# Patient Record
Sex: Female | Born: 1985 | Race: Black or African American | Hispanic: No | Marital: Married | Smoking: Never smoker
Health system: Southern US, Community
[De-identification: ages and names within clinical notes are randomized; demographics above are authoritative.]

## PROBLEM LIST (undated history)

## (undated) ENCOUNTER — Inpatient Hospital Stay (HOSPITAL_COMMUNITY): Payer: Self-pay

## (undated) DIAGNOSIS — A6000 Herpesviral infection of urogenital system, unspecified: Secondary | ICD-10-CM

## (undated) DIAGNOSIS — D649 Anemia, unspecified: Secondary | ICD-10-CM

## (undated) HISTORY — DX: Anemia, unspecified: D64.9

## (undated) HISTORY — DX: Herpesviral infection of urogenital system, unspecified: A60.00

## (undated) HISTORY — PX: NO PAST SURGERIES: SHX2092

---
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
## 2005-03-28 ENCOUNTER — Emergency Department (HOSPITAL_COMMUNITY): Admission: EM | Admit: 2005-03-28 | Discharge: 2005-03-28 | Payer: Self-pay | Admitting: Emergency Medicine

## 2005-06-16 ENCOUNTER — Ambulatory Visit: Payer: Self-pay | Admitting: Family Medicine

## 2005-06-19 ENCOUNTER — Ambulatory Visit: Payer: Self-pay | Admitting: Family Medicine

## 2005-09-29 ENCOUNTER — Ambulatory Visit: Payer: Self-pay | Admitting: Family Medicine

## 2005-10-13 ENCOUNTER — Ambulatory Visit: Payer: Self-pay | Admitting: Family Medicine

## 2005-11-24 ENCOUNTER — Ambulatory Visit: Payer: Self-pay | Admitting: Family Medicine

## 2005-11-27 ENCOUNTER — Ambulatory Visit (HOSPITAL_COMMUNITY): Admission: RE | Admit: 2005-11-27 | Discharge: 2005-11-27 | Payer: Self-pay | Admitting: Family Medicine

## 2006-01-02 ENCOUNTER — Emergency Department (HOSPITAL_COMMUNITY): Admission: EM | Admit: 2006-01-02 | Discharge: 2006-01-02 | Payer: Self-pay | Admitting: *Deleted

## 2006-01-23 ENCOUNTER — Ambulatory Visit: Payer: Self-pay | Admitting: Family Medicine

## 2006-01-25 ENCOUNTER — Ambulatory Visit (HOSPITAL_COMMUNITY): Admission: RE | Admit: 2006-01-25 | Discharge: 2006-01-25 | Payer: Self-pay | Admitting: Family Medicine

## 2006-03-27 ENCOUNTER — Ambulatory Visit: Payer: Self-pay | Admitting: Family Medicine

## 2007-01-16 ENCOUNTER — Emergency Department (HOSPITAL_COMMUNITY): Admission: EM | Admit: 2007-01-16 | Discharge: 2007-01-16 | Payer: Self-pay | Admitting: Emergency Medicine

## 2007-04-07 IMAGING — US US SOFT TISSUE HEAD/NECK
1 series · 14 of 25 positions shown · non-contrast
Comparison: None.

CLINICAL DATA: Goiter. 
THYROID ULTRASOUND:
TECHNIQUE: Ultrasound examination of the thyroid gland and adjacent soft tissue structures was performed.

[Series 1: unknown · 0.09mm/px · 14 of 40 slices shown]
[im 1/40]
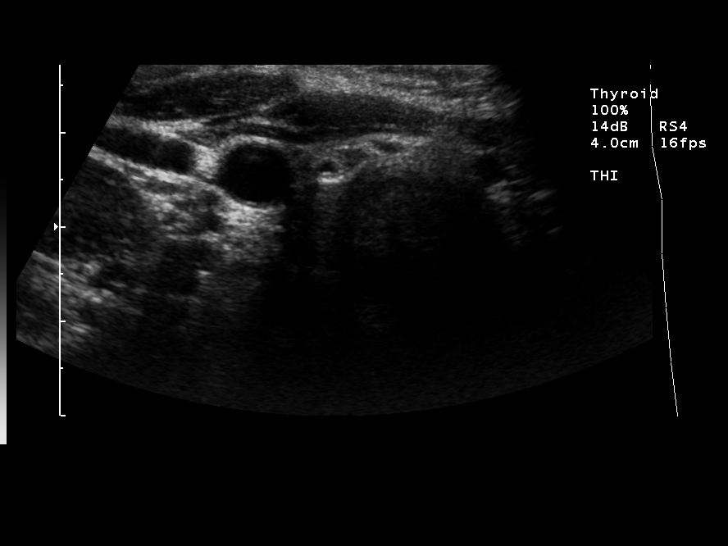
[im 4/40]
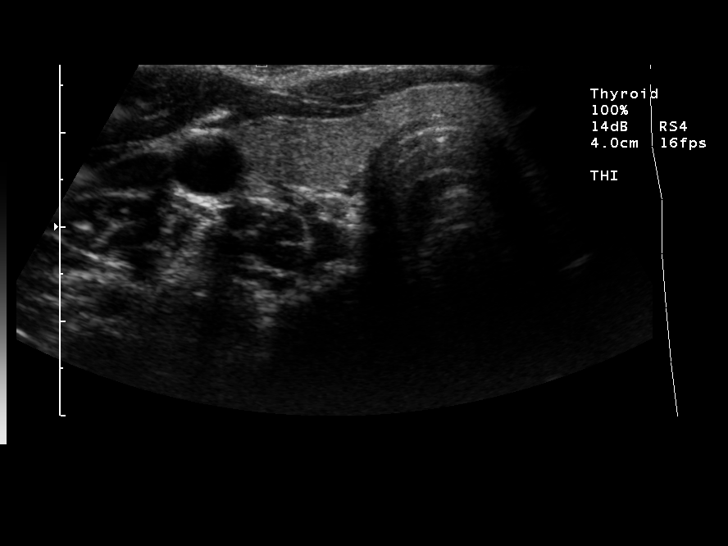
[im 7/40]
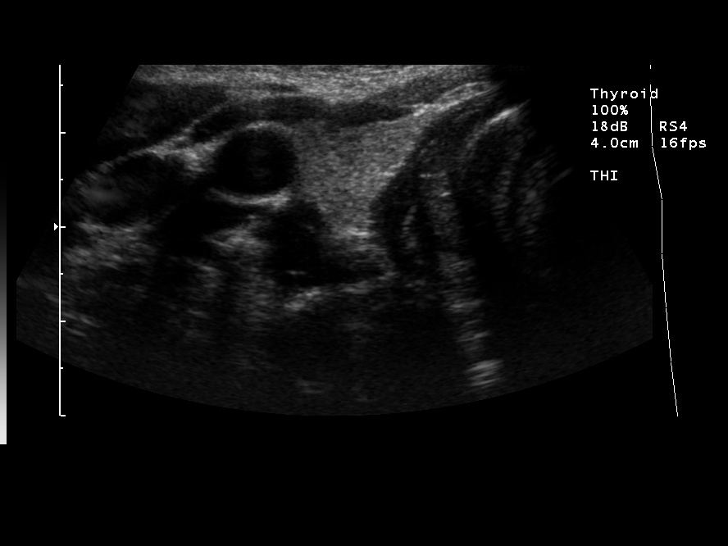
[im 10/40]
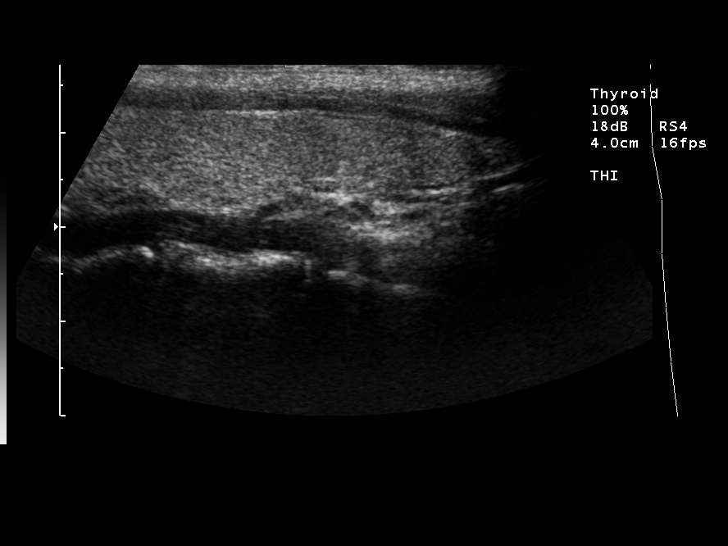
[im 14/40]
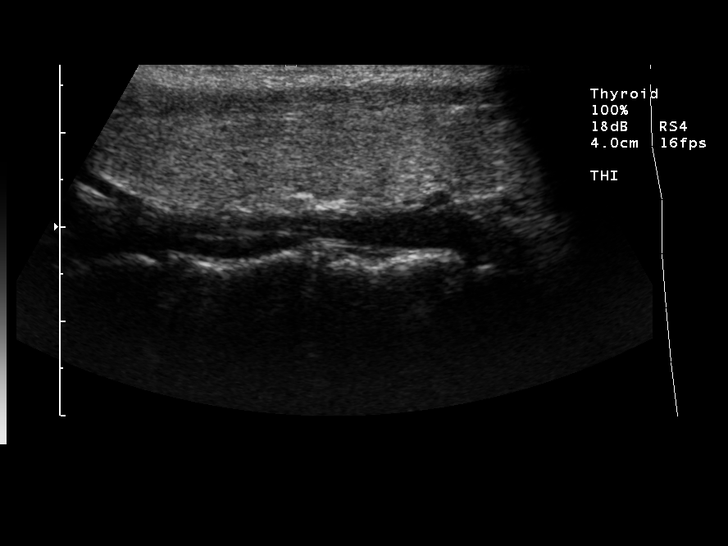
[im 15/40]
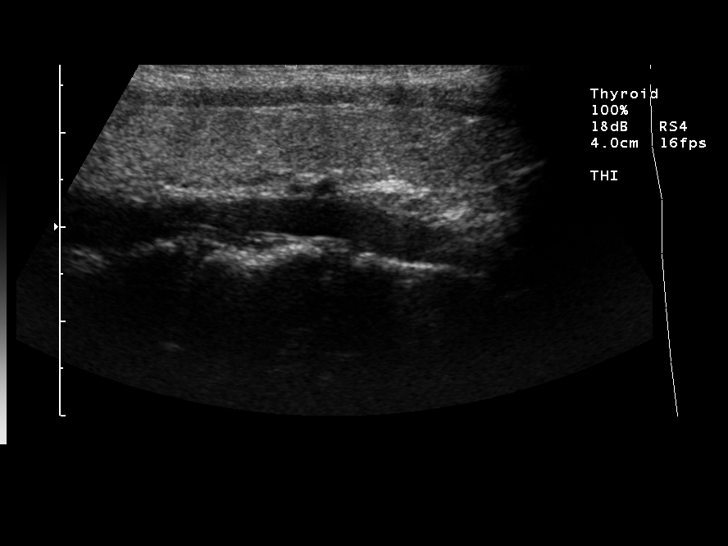
[im 18/40]
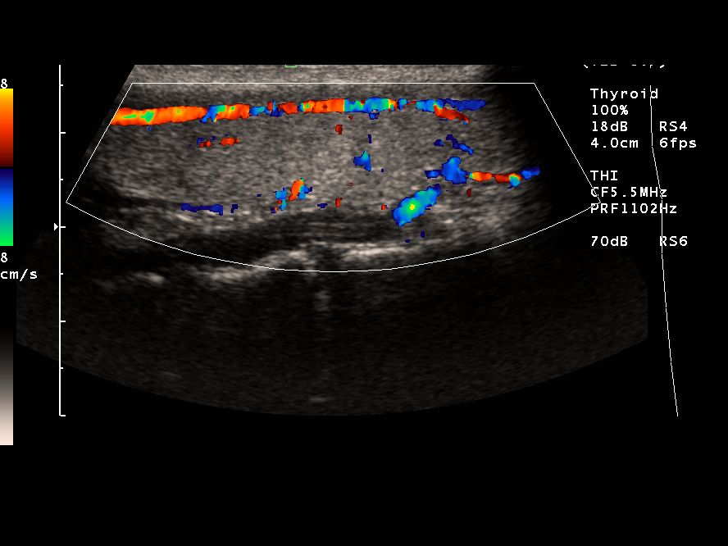
[im 22/40]
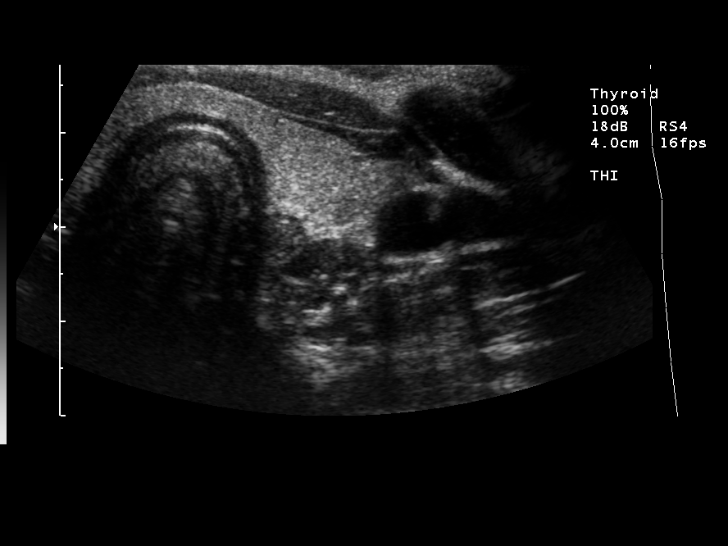
[im 25/40]
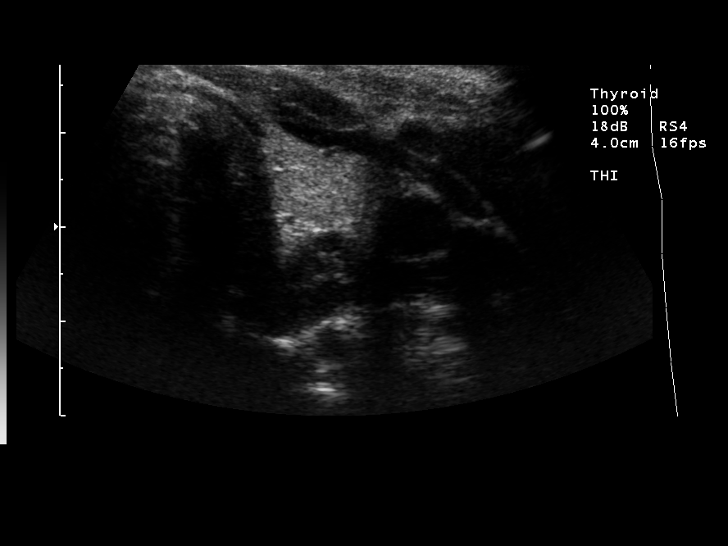
[im 27/40]
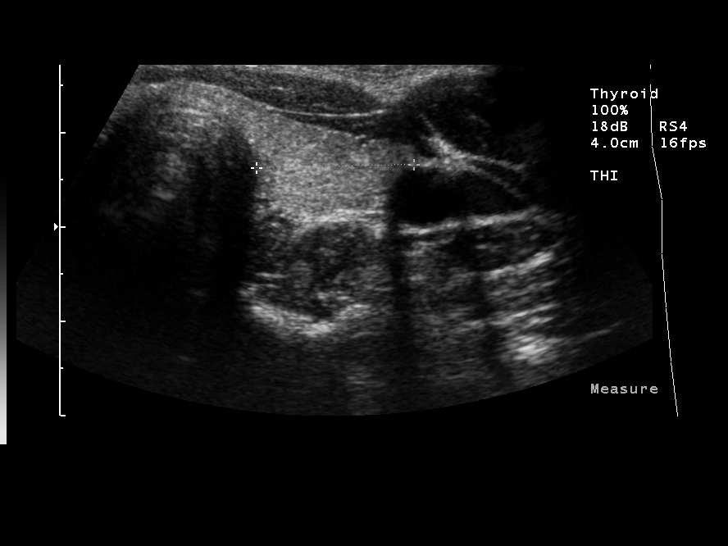
[im 30/40]
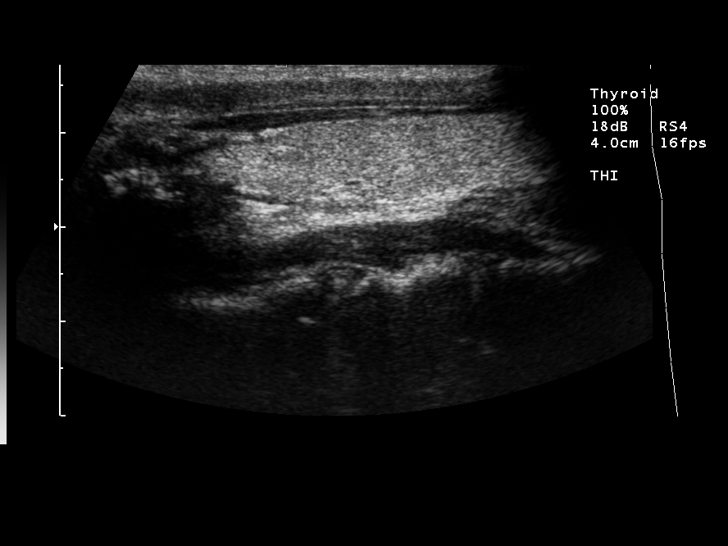
[im 33/40]
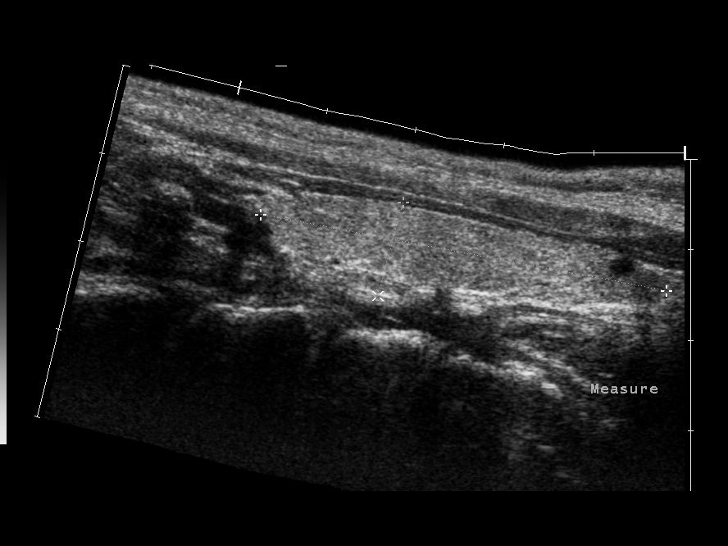
[im 36/40]
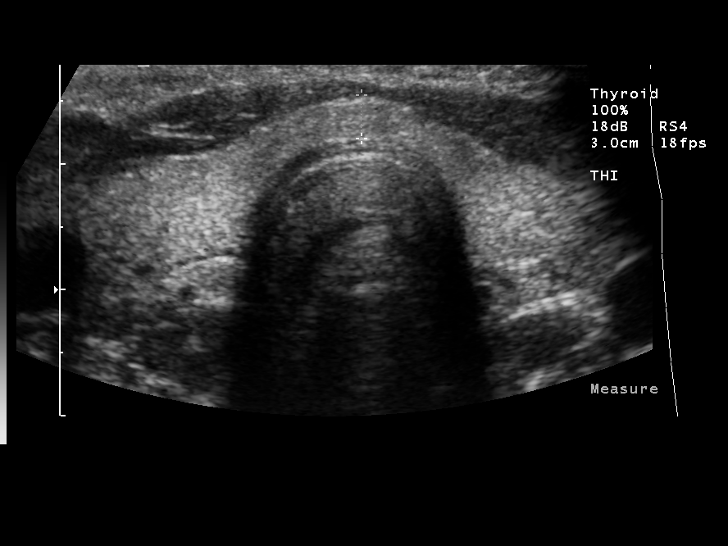
[im 40/40]
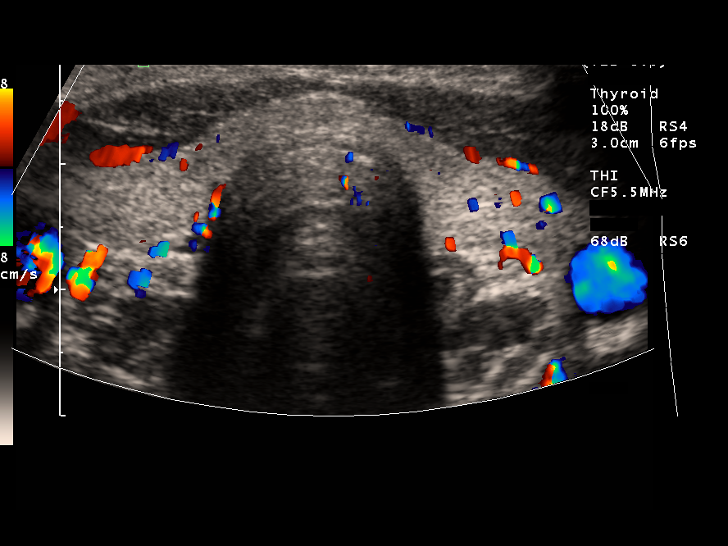

[14 of 25 positions shown; findings below may reference images not displayed]

FINDINGS: The right lobe measures 5.1 x 1.2 x 1.6 cm. The left lobe measures 4.6 x 1.1 x 1.7 cm. The isthmus is 4 mm.  Thyroid echotexture is homogeneous and there is no evidence of focal thyroid lesions.
IMPRESSION: Normal thyroid size without evidence of focal lesion.

## 2007-10-10 ENCOUNTER — Encounter: Payer: Self-pay | Admitting: Family Medicine

## 2007-10-10 DIAGNOSIS — D509 Iron deficiency anemia, unspecified: Secondary | ICD-10-CM | POA: Insufficient documentation

## 2007-10-10 DIAGNOSIS — N926 Irregular menstruation, unspecified: Secondary | ICD-10-CM

## 2007-10-10 DIAGNOSIS — N760 Acute vaginitis: Secondary | ICD-10-CM

## 2009-02-16 ENCOUNTER — Emergency Department (HOSPITAL_COMMUNITY): Admission: EM | Admit: 2009-02-16 | Discharge: 2009-02-17 | Payer: Self-pay | Admitting: Emergency Medicine

## 2009-08-31 ENCOUNTER — Encounter: Payer: Self-pay | Admitting: Family Medicine

## 2010-10-02 ENCOUNTER — Encounter: Payer: Self-pay | Admitting: Family Medicine

## 2010-12-19 LAB — CBC
Hemoglobin: 13.7 g/dL (ref 12.0–15.0)
MCHC: 35.3 g/dL (ref 30.0–36.0)
Platelets: 389 10*3/uL (ref 150–400)
RBC: 4.28 MIL/uL (ref 3.87–5.11)

## 2010-12-19 LAB — URINALYSIS, ROUTINE W REFLEX MICROSCOPIC
Bilirubin Urine: NEGATIVE
Hgb urine dipstick: NEGATIVE
Ketones, ur: >80 mg/dL — AB
Protein, ur: NEGATIVE mg/dL
Specific Gravity, Urine: 1.02 (ref 1.005–1.030)
Urobilinogen, UA: 0.2 mg/dL (ref 0.0–1.0)
pH: 6.5 (ref 5.0–8.0)

## 2010-12-19 LAB — DIFFERENTIAL
Eosinophils Absolute: 0.1 10*3/uL (ref 0.0–0.7)
Lymphocytes Relative: 9 % — ABNORMAL LOW (ref 12–46)
Monocytes Absolute: 0.4 10*3/uL (ref 0.1–1.0)
Monocytes Relative: 5 % (ref 3–12)
Neutrophils Relative %: 85 % — ABNORMAL HIGH (ref 43–77)

## 2010-12-19 LAB — COMPREHENSIVE METABOLIC PANEL
ALT: 24 U/L (ref 0–35)
Albumin: 3.5 g/dL (ref 3.5–5.2)
BUN: 7 mg/dL (ref 6–23)
GFR calc Af Amer: >60 mL/min (ref >60)
Potassium: 3.3 mEq/L — ABNORMAL LOW (ref 3.5–5.1)
Sodium: 140 mEq/L (ref 135–145)

## 2011-01-26 ENCOUNTER — Emergency Department (HOSPITAL_COMMUNITY)
Admission: EM | Admit: 2011-01-26 | Discharge: 2011-01-27 | Disposition: A | Discharge status: Home / Self Care | Payer: Self-pay | Attending: Emergency Medicine | Admitting: Emergency Medicine

## 2011-01-26 DIAGNOSIS — K029 Dental caries, unspecified: Secondary | ICD-10-CM | POA: Insufficient documentation

## 2011-01-26 DIAGNOSIS — K089 Disorder of teeth and supporting structures, unspecified: Secondary | ICD-10-CM | POA: Insufficient documentation

## 2011-05-15 ENCOUNTER — Emergency Department (HOSPITAL_COMMUNITY)
Admission: EM | Admit: 2011-05-15 | Discharge: 2011-05-15 | Disposition: A | Discharge status: Home / Self Care | Payer: Self-pay | Attending: Emergency Medicine | Admitting: Emergency Medicine

## 2011-05-15 DIAGNOSIS — K029 Dental caries, unspecified: Secondary | ICD-10-CM

## 2011-05-15 DIAGNOSIS — K047 Periapical abscess without sinus: Secondary | ICD-10-CM | POA: Insufficient documentation

## 2011-05-15 DIAGNOSIS — K0381 Cracked tooth: Secondary | ICD-10-CM | POA: Insufficient documentation

## 2011-05-15 MED ORDER — HYDROCODONE-ACETAMINOPHEN 5-325 MG PO TABS: 1.0000 | ORAL_TABLET | ORAL | Status: AC | PRN

## 2011-05-15 MED ORDER — IBUPROFEN 800 MG PO TABS: 800.0000 mg | ORAL_TABLET | Freq: Three times a day (TID) | ORAL | Status: AC

## 2011-05-15 MED ORDER — PENICILLIN V POTASSIUM 500 MG PO TABS: 500.0000 mg | ORAL_TABLET | Freq: Three times a day (TID) | ORAL | Status: AC

## 2011-05-15 NOTE — ED Notes (Signed)
Pt reports pain to her rt lower tooth that began last night.  Pt reports that the tooth has been "breaking off" for a while d/t a calcium deficiency.  No swelling or redness noted.

## 2011-05-15 NOTE — ED Provider Notes (Signed)
History     CSN: [id number] Arrival date & time: 05/15/2011  4:31 PM  Chief Complaint  Patient presents with  . Dental Pain   Patient is a 25 y.o. female presenting with tooth pain. The history is provided by the patient.  Dental PainThe primary symptoms include mouth pain and dental injury. Primary symptoms do not include headaches, fever, shortness of breath or sore throat. Primary symptoms comment: She describes having weak teeth,  with having frequent breaks in her teeth when eating.  She broke another piece off a tooth 2 days ago and has increased pain and swelling. The symptoms began 2 days ago. The symptoms are worsening. The symptoms are new. The symptoms occur constantly.  Additional symptoms include: dental sensitivity to temperature, gum swelling and gum tenderness. Additional symptoms do not include: jaw pain, facial swelling and ear pain. Associated symptoms comments: She denies fever.  She has tried taking advil,  Aspirin and goody powders with only short relief of pain.Marland Kitchen    History reviewed. No pertinent past medical history.  History reviewed. No pertinent past surgical history.  No family history on file.  History  Substance Use Topics  . Smoking status: Never Smoker   . Smokeless tobacco: Not on file  . Alcohol Use: No    OB History    Grav Para Term Preterm Abortions TAB SAB Ect Mult Living                  Review of Systems  Constitutional: Negative for fever.  HENT: Positive for dental problem. Negative for ear pain, congestion, sore throat, facial swelling and neck pain.   Eyes: Negative.   Respiratory: Negative for chest tightness and shortness of breath.   Cardiovascular: Negative for chest pain.  Gastrointestinal: Negative for nausea and abdominal pain.  Genitourinary: Negative.   Musculoskeletal: Negative for arthralgias.  Skin: Negative.  Negative for rash and wound.  Neurological: Negative for dizziness, weakness, light-headedness, numbness and  headaches.  Hematological: Negative.   Psychiatric/Behavioral: Negative.     Physical Exam  BP 130/80  Pulse 72  Temp(Src) 98.8 F (37.1 C) (Oral)  Resp 20  Ht 5\' 4"  (1.626 m)  Wt 200 lb (90.719 kg)  BMI 34.33 kg/m2  SpO2 99%  LMP 04/16/2011  Physical Exam  Constitutional: She is oriented to person, place, and time. She appears well-developed and well-nourished. No distress.  HENT:  Head: Normocephalic and atraumatic.  Right Ear: Tympanic membrane and external ear normal.  Left Ear: Tympanic membrane and external ear normal.  Mouth/Throat: Oropharynx is clear and moist and mucous membranes are normal. No oral lesions. Dental abscesses present.    Eyes: Conjunctivae are normal.  Neck: Normal range of motion. Neck supple.  Cardiovascular: Normal rate and normal heart sounds.   Pulmonary/Chest: Effort normal.  Abdominal: She exhibits no distension.  Musculoskeletal: Normal range of motion.  Lymphadenopathy:    She has no cervical adenopathy.  Neurological: She is alert and oriented to person, place, and time.  Skin: Skin is warm and dry. No erythema.  Psychiatric: She has a normal mood and affect.    ED Course  Procedures  MDM Dental fracture.  Dental abscess.      Candis Musa, PA 05/15/11 1649

## 2011-05-16 NOTE — ED Provider Notes (Signed)
Medical screening examination/treatment/procedure(s) were performed by non-physician practitioner and as supervising physician I was immediately available for consultation/collaboration. Devoria Albe, MD, Armando Gang  Ward Givens, MD 05/16/11 Marlyne Beards

## 2015-10-15 ENCOUNTER — Ambulatory Visit: Payer: Self-pay | Admitting: Family Medicine

## 2015-10-19 ENCOUNTER — Ambulatory Visit (INDEPENDENT_AMBULATORY_CARE_PROVIDER_SITE_OTHER): Payer: Managed Care, Other (non HMO) | Admitting: Family Medicine

## 2015-10-19 ENCOUNTER — Ambulatory Visit (INDEPENDENT_AMBULATORY_CARE_PROVIDER_SITE_OTHER): Payer: Managed Care, Other (non HMO)

## 2015-10-19 ENCOUNTER — Encounter: Payer: Self-pay | Admitting: Family Medicine

## 2015-10-19 VITALS — BP 105/69 | HR 69 | Temp 98.3°F | Resp 16 | Ht 64.5 in | Wt 224.0 lb

## 2015-10-19 DIAGNOSIS — Z1329 Encounter for screening for other suspected endocrine disorder: Secondary | ICD-10-CM

## 2015-10-19 DIAGNOSIS — Z0000 Encounter for general adult medical examination without abnormal findings: Secondary | ICD-10-CM

## 2015-10-19 DIAGNOSIS — E669 Obesity, unspecified: Secondary | ICD-10-CM

## 2015-10-19 DIAGNOSIS — M25511 Pain in right shoulder: Secondary | ICD-10-CM | POA: Diagnosis not present

## 2015-10-19 DIAGNOSIS — Z13220 Encounter for screening for lipoid disorders: Secondary | ICD-10-CM | POA: Diagnosis not present

## 2015-10-19 DIAGNOSIS — Z3009 Encounter for other general counseling and advice on contraception: Secondary | ICD-10-CM

## 2015-10-19 DIAGNOSIS — M79672 Pain in left foot: Secondary | ICD-10-CM

## 2015-10-19 DIAGNOSIS — Z23 Encounter for immunization: Secondary | ICD-10-CM

## 2015-10-19 DIAGNOSIS — Z6837 Body mass index (BMI) 37.0-37.9, adult: Secondary | ICD-10-CM | POA: Diagnosis not present

## 2015-10-19 DIAGNOSIS — Z1321 Encounter for screening for nutritional disorder: Secondary | ICD-10-CM | POA: Diagnosis not present

## 2015-10-19 DIAGNOSIS — Z131 Encounter for screening for diabetes mellitus: Secondary | ICD-10-CM

## 2015-10-19 LAB — CBC WITH DIFFERENTIAL/PLATELET
BASOS PCT: 0 % (ref 0–1)
Basophils Absolute: 0 10*3/uL (ref 0.0–0.1)
Eosinophils Absolute: 0.2 10*3/uL (ref 0.0–0.7)
Eosinophils Relative: 2 % (ref 0–5)
HEMATOCRIT: 36.7 % (ref 36.0–46.0)
HEMOGLOBIN: 12.1 g/dL (ref 12.0–15.0)
LYMPHS ABS: 4.6 10*3/uL — AB (ref 0.7–4.0)
Lymphocytes Relative: 40 % (ref 12–46)
MCH: 29.8 pg (ref 26.0–34.0)
MCHC: 33 g/dL (ref 30.0–36.0)
MCV: 90.4 fL (ref 78.0–100.0)
MONO ABS: 0.8 10*3/uL (ref 0.1–1.0)
MONOS PCT: 7 % (ref 3–12)
MPV: 10 fL (ref 8.6–12.4)
NEUTROS ABS: 5.8 10*3/uL (ref 1.7–7.7)
Neutrophils Relative %: 51 % (ref 43–77)
Platelets: 516 10*3/uL — ABNORMAL HIGH (ref 150–400)
RBC: 4.06 MIL/uL (ref 3.87–5.11)
RDW: 12.6 % (ref 11.5–15.5)
WBC: 11.4 10*3/uL — ABNORMAL HIGH (ref 4.0–10.5)

## 2015-10-19 LAB — POCT URINALYSIS DIP (MANUAL ENTRY)
BILIRUBIN UA: NEGATIVE
GLUCOSE UA: NEGATIVE
Ketones, POC UA: NEGATIVE
LEUKOCYTES UA: NEGATIVE
NITRITE UA: NEGATIVE
PH UA: 6
Protein Ur, POC: NEGATIVE
Spec Grav, UA: 1.025
Urobilinogen, UA: 0.2

## 2015-10-19 LAB — VITAMIN B12: VITAMIN B 12: 1133 pg/mL — AB (ref 200–1100)

## 2015-10-19 LAB — TSH: TSH: 1.58 m[IU]/L

## 2015-10-19 LAB — POCT URINE PREGNANCY: Preg Test, Ur: NEGATIVE

## 2015-10-19 NOTE — Patient Instructions (Signed)
Generic Shoulder Exercises  EXERCISES   RANGE OF MOTION (ROM) AND STRETCHING EXERCISES  These exercises may help you when beginning to rehabilitate your injury. Your symptoms may resolve with or without further involvement from your physician, physical therapist or athletic trainer. While completing these exercises, remember:   · Restoring tissue flexibility helps normal motion to return to the joints. This allows healthier, less painful movement and activity.  · An effective stretch should be held for at least 30 seconds.  · A stretch should never be painful. You should only feel a gentle lengthening or release in the stretched tissue.  ROM - Pendulum  · Bend at the waist so that your right / left arm falls away from your body. Support yourself with your opposite hand on a solid surface, such as a table or a countertop.  · Your right / left arm should be perpendicular to the ground. If it is not perpendicular, you need to lean over farther. Relax the muscles in your right / left arm and shoulder as much as possible.  · Gently sway your hips and trunk so they move your right / left arm without any use of your right / left shoulder muscles.  · Progress your movements so that your right / left arm moves side to side, then forward and backward, and finally, both clockwise and counterclockwise.  · Complete __________ repetitions in each direction. Many people use this exercise to relieve discomfort in their shoulder as well as to gain range of motion.  Repeat __________ times. Complete this exercise __________ times per day.  STRETCH - Flexion, Standing  · Stand with good posture. With an underhand grip on your right / left hand and an overhand grip on the opposite hand, grasp a broomstick or cane so that your hands are a little more than shoulder-width apart.  · Keeping your right / left elbow straight and shoulder muscles relaxed, push the stick with your opposite hand to raise your right / left arm in front of your  body and then overhead. Raise your arm until you feel a stretch in your right / left shoulder, but before you have increased shoulder pain.  · Try to avoid shrugging your right / left shoulder as your arm rises by keeping your shoulder blade tucked down and toward your mid-back spine. Hold __________ seconds.  · Slowly return to the starting position.  Repeat __________ times. Complete this exercise __________ times per day.  STRETCH - Internal Rotation  · Place your right / left hand behind your back, palm-up.  · Throw a towel or belt over your opposite shoulder. Grasp the towel/belt with your right / left hand.  · While keeping an upright posture, gently pull up on the towel/belt until you feel a stretch in the front of your right / left shoulder.  · Avoid shrugging your right / left shoulder as your arm rises by keeping your shoulder blade tucked down and toward your mid-back spine.  · Hold __________. Release the stretch by lowering your opposite hand.  Repeat __________ times. Complete this exercise __________ times per day.  STRETCH - External Rotation and Abduction  · Stagger your stance through a doorframe. It does not matter which foot is forward.  · As instructed by your physician, physical therapist or athletic trainer, place your hands:    And forearms above your head and on the door frame.    And forearms at head-height and on the door frame.      At elbow-height and on the door frame.  · Keeping your head and chest upright and your stomach muscles tight to prevent over-extending your low-back, slowly shift your weight onto your front foot until you feel a stretch across your chest and/or in the front of your shoulders.  · Hold __________ seconds. Shift your weight to your back foot to release the stretch.  Repeat __________ times. Complete this stretch __________ times per day.   STRENGTHENING EXERCISES   These exercises may help you when beginning to rehabilitate your injury. They may resolve your  symptoms with or without further involvement from your physician, physical therapist or athletic trainer. While completing these exercises, remember:   · Muscles can gain both the endurance and the strength needed for everyday activities through controlled exercises.  · Complete these exercises as instructed by your physician, physical therapist or athletic trainer. Progress the resistance and repetitions only as guided.  · You may experience muscle soreness or fatigue, but the pain or discomfort you are trying to eliminate should never worsen during these exercises. If this pain does worsen, stop and make certain you are following the directions exactly. If the pain is still present after adjustments, discontinue the exercise until you can discuss the trouble with your clinician.  · If advised by your physician, during your recovery, avoid activity or exercises which involve actions that place your right / left hand or elbow above your head or behind your back or head. These positions stress the tissues which are trying to heal.  STRENGTH - Scapular Depression and Adduction  · With good posture, sit on a firm chair. Supported your arms in front of you with pillows, arm rests or a table top. Have your elbows in line with the sides of your body.  · Gently draw your shoulder blades down and toward your mid-back spine. Gradually increase the tension without tensing the muscles along the top of your shoulders and the back of your neck.  · Hold for __________ seconds. Slowly release the tension and relax your muscles completely before completing the next repetition.  · After you have practiced this exercise, remove the arm support and complete it in standing as well as sitting.  Repeat __________ times. Complete this exercise __________ times per day.   STRENGTH - External Rotators  · Secure a rubber exercise band/tubing to a fixed object so that it is at the same height as your right / left elbow when you are standing  or sitting on a firm surface.  · Stand or sit so that the secured exercise band/tubing is at your side that is not injured.  · Bend your elbow 90 degrees. Place a folded towel or small pillow under your right / left arm so that your elbow is a few inches away from your side.  · Keeping the tension on the exercise band/tubing, pull it away from your body, as if pivoting on your elbow. Be sure to keep your body steady so that the movement is only coming from your shoulder rotating.  · Hold __________ seconds. Release the tension in a controlled manner as you return to the starting position.  Repeat __________ times. Complete this exercise __________ times per day.   STRENGTH - Supraspinatus  · Stand or sit with good posture. Grasp a __________ weight or an exercise band/tubing so that your hand is "thumbs-up," like when you shake hands.  · Slowly lift your right / left hand from your thigh into the air,   traveling about 30 degrees from straight out at your side. Lift your hand to shoulder height or as far as you can without increasing any shoulder pain. Initially, many people do not lift their hands above shoulder height.  · Avoid shrugging your right / left shoulder as your arm rises by keeping your shoulder blade tucked down and toward your mid-back spine.  · Hold for __________ seconds. Control the descent of your hand as you slowly return to your starting position.  Repeat __________ times. Complete this exercise __________ times per day.   STRENGTH - Shoulder Extensors  · Secure a rubber exercise band/tubing so that it is at the height of your shoulders when you are either standing or sitting on a firm arm-less chair.  · With a thumbs-up grip, grasp an end of the band/tubing in each hand. Straighten your elbows and lift your hands straight in front of you at shoulder height. Step back away from the secured end of band/tubing until it becomes tense.  · Squeezing your shoulder blades together, pull your hands down  to the sides of your thighs. Do not allow your hands to go behind you.  · Hold for __________ seconds. Slowly ease the tension on the band/tubing as you reverse the directions and return to the starting position.  Repeat __________ times. Complete this exercise __________ times per day.   STRENGTH - Scapular Retractors  · Secure a rubber exercise band/tubing so that it is at the height of your shoulders when you are either standing or sitting on a firm arm-less chair.  · With a palm-down grip, grasp an end of the band/tubing in each hand. Straighten your elbows and lift your hands straight in front of you at shoulder height. Step back away from the secured end of band/tubing until it becomes tense.  · Squeezing your shoulder blades together, draw your elbows back as you bend them. Keep your upper arm lifted away from your body throughout the exercise.  · Hold __________ seconds. Slowly ease the tension on the band/tubing as you reverse the directions and return to the starting position.  Repeat __________ times. Complete this exercise __________ times per day.  STRENGTH - Scapular Depressors  · Find a sturdy chair without wheels, such as a from a dining room table.  · Keeping your feet on the floor, lift your bottom from the seat and lock your elbows.  · Keeping your elbows straight, allow gravity to pull your body weight down. Your shoulders will rise toward your ears.  · Raise your body against gravity by drawing your shoulder blades down your back, shortening the distance between your shoulders and ears. Although your feet should always maintain contact with the floor, your feet should progressively support less body weight as you get stronger.  · Hold __________ seconds. In a controlled and slow manner, lower your body weight to begin the next repetition.  Repeat __________ times. Complete this exercise __________ times per day.      This information is not intended to replace advice given to you by your health  care provider. Make sure you discuss any questions you have with your health care provider.     Document Released: 07/12/2005 Document Revised: 09/18/2014 Document Reviewed: 12/10/2008  Elsevier Interactive Patient Education ©2016 [employer]

## 2015-10-19 NOTE — Progress Notes (Signed)
Subjective:    Patient ID: Bonnie Evans, female    DOB: 1986/02/03, 30 y.o.   MRN: [id number]  10/19/2015  Establish Care; Shoulder Pain; and foot pressure   HPI This 30 y.o. female presents to establish care.    Last physical:  Not sure. Pap smear: 12/2014 Gynecologist; menses regular (5 day/moderate flow/no cramping).  G0P0  No history of abnormal pap smears.  LMP 10-09-2015 normal.  TDAP: not sure; more than ten years.   Influenza:  Not this year; job offers it.   Eye exam:  Several years; no glasses or contacts. Dental exam:  Last several months.   R shoulder pain: onset two years ago.  L lateral foot pain: onset three years ago.  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   Review of Systems  Constitutional: Negative for fever, chills, diaphoresis, activity change, appetite change, fatigue and unexpected weight change.  HENT: Positive for hearing loss. Negative for congestion, dental problem, drooling, ear discharge, ear pain, facial swelling, mouth sores, nosebleeds, postnasal drip, rhinorrhea, sinus pressure, sneezing, sore throat, tinnitus, trouble swallowing and voice change.   Eyes: Negative for photophobia, pain, discharge, redness, itching and visual disturbance.  Respiratory: Negative for apnea, cough, choking, chest tightness, shortness of breath, wheezing and stridor.   Cardiovascular: Negative for chest pain, palpitations and leg swelling.  Gastrointestinal: Positive for constipation. Negative for nausea, vomiting, abdominal pain, diarrhea, blood in stool, abdominal distention, anal bleeding and rectal pain.       +heartburn/reflux with lemonade  Endocrine: Negative for cold intolerance, heat intolerance, polydipsia, polyphagia and polyuria.  Genitourinary: Negative for dysuria, urgency, frequency, hematuria, flank pain, decreased urine volume, vaginal bleeding, vaginal discharge, enuresis, difficulty urinating, genital sores, vaginal pain, menstrual problem, pelvic pain and  dyspareunia.  Musculoskeletal: Positive for arthralgias. Negative for myalgias, back pain, joint swelling, gait problem, neck pain and neck stiffness.       R shoulder pain for two years; no injury.  Daily pain.  Skin: Negative for color change, pallor, rash and wound.  Allergic/Immunologic: Negative for environmental allergies, food allergies and immunocompromised state.  Neurological: Negative for dizziness, tremors, seizures, syncope, facial asymmetry, speech difficulty, weakness, light-headedness, numbness and headaches.  Hematological: Negative for adenopathy. Does not bruise/bleed easily.  Psychiatric/Behavioral: Negative for suicidal ideas, hallucinations, behavioral problems, confusion, sleep disturbance, self-injury, dysphoric mood, decreased concentration and agitation. The patient is not nervous/anxious and is not hyperactive.        Bedtime 12:30am; awakens 9:00am    Past Medical History  Diagnosis Date  . Anemia   . Genital herpes    History reviewed. No pertinent past surgical history. No Known Allergies  Social History   Social History  . Marital Status: Single    Spouse Name: N/A  . Number of Children: N/A  . Years of Education: N/A   Occupational History  . Not on file.   Social History Main Topics  . Smoking status: Never Smoker   . Smokeless tobacco: Not on file  . Alcohol Use: No  . Drug Use: No  . Sexual Activity: Yes    Birth Control/ Protection: None   Other Topics Concern  . Not on file   Social History Narrative   Marital status: married x 2 years; happily married; no abuse      Children: none; 1 stepchild/son (9yo)      Lives: with husband; stepson on weekends      Employment: [employer] x 1 year; happy      Education:  going to school for nursing; part time student      Tobacco: none      Alcohol: none      Drugs; marijuana      Exercise:  Sporadic; twice weekly to gym; bike for 30 minutes; weights      Seatbelt: 75% compliance; no  texting      Sexual activity: sexual activity; twelve total partners; history of Chlamydia, HSV.  Last STD screening last year in 2016.   Family History  Problem Relation Age of Onset  . Hypertension Mother   . Goiter Mother   . Asthma Brother        Objective:    BP 105/69 mmHg  Pulse 69  Temp(Src) 98.3 F (36.8 C) (Oral)  Resp 16  Ht 5' 4.5" (1.638 m)  Wt 224 lb (101.606 kg)  BMI 37.87 kg/m2  SpO2 99%  LMP 10/09/2015 Physical Exam  Constitutional: She is oriented to person, place, and time. She appears well-developed and well-nourished. No distress.  HENT:  Head: Normocephalic and atraumatic.  Right Ear: External ear normal.  Left Ear: External ear normal.  Nose: Nose normal.  Mouth/Throat: Oropharynx is clear and moist.  Eyes: Conjunctivae and EOM are normal. Pupils are equal, round, and reactive to light.  Neck: Normal range of motion and full passive range of motion without pain. Neck supple. No JVD present. Carotid bruit is not present. No thyromegaly present.  Cardiovascular: Normal rate, regular rhythm, normal heart sounds and intact distal pulses.  Exam reveals no gallop and no friction rub.   No murmur heard. Pulmonary/Chest: Effort normal and breath sounds normal. She has no wheezes. She has no rales.  Abdominal: Soft. Bowel sounds are normal. She exhibits no distension and no mass. There is no tenderness. There is no rebound and no guarding.  Musculoskeletal:       Right shoulder: Normal.       Left shoulder: Normal.       Cervical back: Normal.  Lymphadenopathy:    She has no cervical adenopathy.  Neurological: She is alert and oriented to person, place, and time. She has normal reflexes. No cranial nerve deficit. She exhibits normal muscle tone. Coordination normal.  Skin: Skin is warm and dry. No rash noted. She is not diaphoretic. No erythema. No pallor.  Psychiatric: She has a normal mood and affect. Her behavior is normal. Judgment and thought content  normal.  Nursing note and vitals reviewed.       Assessment & Plan:   1. Routine physical examination   2. Screening, lipid   3. Screening for diabetes mellitus   4. Screening for thyroid disorder   5. Family planning   6. Encounter for vitamin deficiency screening   7. Obesity   8. BMI 37.0-37.9, adult   9. Left foot pain   10. Pain in joint of right shoulder   11. Need for Tdap vaccination     Orders Placed This Encounter  Procedures  . DG Shoulder Right    Standing Status: Future     Number of Occurrences: 1     Standing Expiration Date: 10/18/2016    Order Specific Question:  Reason for Exam (SYMPTOM  OR DIAGNOSIS REQUIRED)    Answer:  L foot pain for three years along fifth metatarsal    Order Specific Question:  Is the patient pregnant?    Answer:  No    Order Specific Question:  Preferred imaging location?    Answer:  External  .  DG Foot Complete Left    Standing Status: Future     Number of Occurrences: 1     Standing Expiration Date: 10/18/2016    Order Specific Question:  Reason for Exam (SYMPTOM  OR DIAGNOSIS REQUIRED)    Answer:  L foot pain for three years along fifth metatarsal    Order Specific Question:  Is the patient pregnant?    Answer:  No    Order Specific Question:  Preferred imaging location?    Answer:  External  . Tdap vaccine greater than or equal to 7yo IM  . CBC with Differential/Platelet  . Comprehensive metabolic panel    Order Specific Question:  Has the patient fasted?    Answer:  Yes  . Hemoglobin A1c  . Lipid panel    Order Specific Question:  Has the patient fasted?    Answer:  Yes  . TSH  . Vitamin B12  . VITAMIN D 25 Hydroxy (Vit-D Deficiency, Fractures)  . POCT urine pregnancy  . POCT urinalysis dipstick   Meds ordered this encounter  Medications  . Iron-Vitamins (GERITOL COMPLETE) TABS    Sig: Take by mouth as needed.  . Vitamin D, Ergocalciferol, (DRISDOL) 50000 units CAPS capsule    Sig: Take 1 capsule (50,000 Units  total) by mouth every 7 (seven) days.    Dispense:  12 capsule    Refill:  0    No Follow-up on file.    Bonnie Evans, M.D. [facility] [address] [place], [place]  [address] [phone] phone [phone] fax

## 2015-10-20 LAB — VITAMIN D 25 HYDROXY (VIT D DEFICIENCY, FRACTURES): Vit D, 25-Hydroxy: 14 ng/mL — ABNORMAL LOW (ref 30–100)

## 2015-10-20 LAB — COMPREHENSIVE METABOLIC PANEL
ALBUMIN: 4.1 g/dL (ref 3.6–5.1)
ALK PHOS: 53 U/L (ref 33–115)
ALT: 9 U/L (ref 6–29)
AST: 12 U/L (ref 10–30)
BILIRUBIN TOTAL: 0.3 mg/dL (ref 0.2–1.2)
BUN: 9 mg/dL (ref 7–25)
CALCIUM: 8.8 mg/dL (ref 8.6–10.2)
CHLORIDE: 103 mmol/L (ref 98–110)
CO2: 27 mmol/L (ref 20–31)
CREATININE: 0.67 mg/dL (ref 0.50–1.10)
Glucose, Bld: 70 mg/dL (ref 65–99)
POTASSIUM: 3.9 mmol/L (ref 3.5–5.3)
SODIUM: 140 mmol/L (ref 135–146)
TOTAL PROTEIN: 7.4 g/dL (ref 6.1–8.1)

## 2015-10-20 LAB — LIPID PANEL
CHOL/HDL RATIO: 2.8 ratio (ref <=5.0)
Cholesterol: 163 mg/dL (ref 125–200)
HDL: 58 mg/dL (ref >=46)
LDL CALC: 92 mg/dL (ref <130)
Triglycerides: 66 mg/dL (ref <150)
VLDL: 13 mg/dL (ref <30)

## 2015-10-20 LAB — HEMOGLOBIN A1C
Hgb A1c MFr Bld: 5 % (ref <5.7)
Mean Plasma Glucose: 97 mg/dL (ref <117)

## 2015-10-21 MED ORDER — VITAMIN D (ERGOCALCIFEROL) 1.25 MG (50000 UNIT) PO CAPS: 50000.0000 [IU] | ORAL_CAPSULE | ORAL | Status: DC

## 2016-01-26 ENCOUNTER — Other Ambulatory Visit: Payer: Self-pay | Admitting: Family Medicine

## 2016-04-08 DIAGNOSIS — N979 Female infertility, unspecified: Secondary | ICD-10-CM | POA: Insufficient documentation

## 2016-08-01 ENCOUNTER — Ambulatory Visit: Payer: Managed Care, Other (non HMO) | Admitting: Family Medicine

## 2016-08-09 ENCOUNTER — Ambulatory Visit (INDEPENDENT_AMBULATORY_CARE_PROVIDER_SITE_OTHER): Payer: Managed Care, Other (non HMO) | Admitting: Family Medicine

## 2016-08-09 ENCOUNTER — Ambulatory Visit (INDEPENDENT_AMBULATORY_CARE_PROVIDER_SITE_OTHER): Payer: Managed Care, Other (non HMO)

## 2016-08-09 ENCOUNTER — Encounter: Payer: Self-pay | Admitting: Family Medicine

## 2016-08-09 VITALS — BP 114/76 | HR 86 | Temp 98.6°F | Resp 18 | Ht 64.5 in | Wt 230.0 lb

## 2016-08-09 DIAGNOSIS — B977 Papillomavirus as the cause of diseases classified elsewhere: Secondary | ICD-10-CM

## 2016-08-09 DIAGNOSIS — R0782 Intercostal pain: Secondary | ICD-10-CM

## 2016-08-09 DIAGNOSIS — Z7251 High risk heterosexual behavior: Secondary | ICD-10-CM

## 2016-08-09 DIAGNOSIS — S29011A Strain of muscle and tendon of front wall of thorax, initial encounter: Secondary | ICD-10-CM | POA: Diagnosis not present

## 2016-08-09 DIAGNOSIS — J069 Acute upper respiratory infection, unspecified: Secondary | ICD-10-CM | POA: Diagnosis not present

## 2016-08-09 DIAGNOSIS — D473 Essential (hemorrhagic) thrombocythemia: Secondary | ICD-10-CM | POA: Diagnosis not present

## 2016-08-09 DIAGNOSIS — D75839 Thrombocytosis, unspecified: Secondary | ICD-10-CM

## 2016-08-09 LAB — POCT URINE PREGNANCY: Preg Test, Ur: NEGATIVE

## 2016-08-09 NOTE — Patient Instructions (Addendum)
     IF you received an x-ray today, you will receive an invoice from [facility]. Please contact [facility] at [phone] with questions or concerns regarding your invoice.   IF you received labwork today, you will receive an invoice from [employer]. Please contact [facility] at [phone] with questions or concerns regarding your invoice.   Our billing staff will not be able to assist you with questions regarding bills from these companies.  You will be contacted with the lab results as soon as they are available. The fastest way to get your results is to activate your My Chart account. Instructions are located on the last page of this paperwork. If you have not heard from [place] regarding the results in 2 weeks, please contact this office.      Chest Wall Pain Chest wall pain is pain in or around the bones and muscles of your chest. Sometimes, an injury causes this pain. Sometimes, the cause may not be known. This pain may take several weeks or longer to get better. Follow these instructions at home: Pay attention to any changes in your symptoms. Take these actions to help with your pain:  Rest as told by your health care provider.  Avoid activities that cause pain. These include any activities that use your chest muscles or your abdominal and side muscles to lift heavy items.  If directed, apply ice to the painful area:  Put ice in a plastic bag.  Place a towel between your skin and the bag.  Leave the ice on for 20 minutes, 2-3 times per day.  Take over-the-counter and prescription medicines only as told by your health care provider.  Do not use tobacco products, including cigarettes, chewing tobacco, and e-cigarettes. If you need help quitting, ask your health care provider.  Keep all follow-up visits as told by your health care provider. This is important. Contact a health care provider if:  You have a fever.  Your chest pain  becomes worse.  You have new symptoms. Get help right away if:  You have nausea or vomiting.  You feel sweaty or light-headed.  You have a cough with phlegm (sputum) or you cough up blood.  You develop shortness of breath. This information is not intended to replace advice given to you by your health care provider. Make sure you discuss any questions you have with your health care provider. Document Released: 08/28/2005 Document Revised: 01/06/2016 Document Reviewed: 11/23/2014 Elsevier Interactive Patient Education  2017 [employer].

## 2016-08-09 NOTE — Progress Notes (Signed)
Subjective:    Patient ID: Bonnie Evans, female    DOB: 12/06/1985, 30 y.o.   MRN: [id number]  08/09/2016  Chest Pain (hot/cold chills, felt pressure on right side of chest on Wednesday, then change sharp pain)   HPI This 30 y.o. female presents for evaluation of R sided sharp pain five days ago.  Could feel a hard area on the R side at a time of pain. Intermittent pain in that R sided area. Now having sharp pains; not every hour but frequent enough to worry.  Took Tylenol recently and now R sided chest pain better.  Duration of symptoms really short; quick stabbing pain.  Pinching sensation; no neck pain. No triggers.  Eating has no effect. Has been laying on couch propped up on R elbow with onset.  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  No cough really.  Exercise does nto trigger; taking deep breath dose trigger; can also occur at rest.    Chills and sweats for two days.  +nasal congestion for today.  No sore throat; no ear pain. No cough. Sleeps with fan.  No n/v/d.  No rash.  No dysuria, frequency, urgency, nocturia worsening.   [employer].  Series of tests for free through employment; platelet count is high. Level 559,000; lymphocyte count was elevated as well.  Recommended evaluation of elevated platelet.  Also recently tested positive for HPV; would like full STD screening.   Review of Systems  Constitutional: Positive for chills and diaphoresis. Negative for fatigue and fever.  HENT: Positive for congestion and rhinorrhea. Negative for ear pain and sore throat.   Respiratory: Negative for cough, shortness of breath, wheezing and stridor.   Cardiovascular: Positive for chest pain. Negative for palpitations and leg swelling.  Gastrointestinal: Negative for abdominal pain, diarrhea, nausea and vomiting.  Genitourinary: Negative for dysuria, flank pain, frequency, genital  sores, hematuria, menstrual problem, pelvic pain and urgency.  Neurological: Negative for headaches.  Hematological: Negative for adenopathy. Does not bruise/bleed easily.    Past Medical History:  Diagnosis Date  . Anemia   . Genital herpes    History reviewed. No pertinent surgical history. No Known Allergies Current Outpatient Prescriptions  Medication Sig Dispense Refill  . Iron-Vitamins (GERITOL COMPLETE) TABS Take by mouth as needed.    . Vitamin D, Ergocalciferol, (DRISDOL) 50000 units CAPS capsule TAKE 1 CAPSULE BY MOUTH EVERY 7 DAYS 4 capsule 0   No current facility-administered medications for this visit.    Social History   Social History  . Marital status: Single    Spouse name: N/A  . Number of children: N/A  . Years of education: N/A   Occupational History  . Not on file.   Social History Main Topics  . Smoking status: Never Smoker  . Smokeless tobacco: Never Used  . Alcohol use No  . Drug use: No  . Sexual activity: Yes    Birth control/ protection: None   Other Topics Concern  . Not on file   Social History Narrative   Marital status: married x 2 years; happily married; no abuse      Children: none; 1 stepchild/son (9yo)      Lives: with husband; stepson on weekends      Employment: [employer] x 1 year; happy      Education: going to school for nursing; part time Ship broker  Tobacco: none      Alcohol: none      Drugs; marijuana      Exercise:  Sporadic; twice weekly to gym; bike for 30 minutes; weights      Seatbelt: 75% compliance; no texting      Sexual activity: sexual activity; twelve total partners; history of Chlamydia, HSV.  Last STD screening last year in 2016.   Family History  Problem Relation Age of Onset  . Hypertension Mother   . Goiter Mother   . Asthma Brother        Objective:    BP 114/76 (BP Location: Right Arm, Patient Position: Sitting, Cuff Size: Small)   Pulse 86   Temp 98.6 F (37 C) (Oral)   Resp 18    Ht 5' 4.5" (1.638 m)   Wt 230 lb (104.3 kg)   LMP 07/13/2016 (Exact Date)   SpO2 98%   BMI 38.87 kg/m  Physical Exam  Constitutional: She is oriented to person, place, and time. She appears well-developed and well-nourished. No distress.  HENT:  Head: Normocephalic and atraumatic.  Right Ear: External ear normal.  Left Ear: External ear normal.  Nose: Nose normal.  Mouth/Throat: Oropharynx is clear and moist.  Eyes: Conjunctivae and EOM are normal. Pupils are equal, round, and reactive to light.  Neck: Normal range of motion. Neck supple. Carotid bruit is not present. No thyromegaly present.  Cardiovascular: Normal rate, regular rhythm, normal heart sounds and intact distal pulses.  Exam reveals no gallop and no friction rub.   No murmur heard. Pulmonary/Chest: Effort normal and breath sounds normal. She has no wheezes. She has no rales. She exhibits tenderness. Right breast exhibits tenderness. Right breast exhibits no inverted nipple, no mass, no nipple discharge and no skin change. Left breast exhibits no inverted nipple, no mass, no nipple discharge, no skin change and no tenderness. Breasts are symmetrical.    +TTP R upper chest wall at superior aspect of breast at 11 o'clock.  Abdominal: Soft. Bowel sounds are normal. She exhibits no distension and no mass. There is no tenderness. There is no rebound and no guarding.  Musculoskeletal:       Right shoulder: Normal. She exhibits normal range of motion, no tenderness and no bony tenderness.       Cervical back: Normal. She exhibits normal range of motion, no tenderness, no bony tenderness and no swelling.  Pain in R anterior chest reproduced with extending R shoulder back.  Lymphadenopathy:    She has no cervical adenopathy.  Neurological: She is alert and oriented to person, place, and time. No cranial nerve deficit.  Skin: Skin is warm and dry. No rash noted. She is not diaphoretic. No erythema. No pallor.  Psychiatric: She has  a normal mood and affect. Her behavior is normal.    Dg Chest 2 View  Result Date: 08/09/2016 CLINICAL DATA:  Pain. EXAM: CHEST  2 VIEW COMPARISON:  No recent prior. FINDINGS: Mediastinum and hilar structures are normal. Heart size normal. Lungs are clear. No pleural effusion or pneumothorax. IMPRESSION: No acute cardiopulmonary disease. Electronically Signed   By: Marcello Moores  Register   On: 08/09/2016 17:04       Assessment & Plan:   1. Intercostal pain   2. Muscle strain of chest wall, initial encounter   3. 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 Acute upper respiratory infection   6. High risk sexual behavior    -New R sided chest wall  pain; likely due to strain; obtain CXR; also obtain diagnostic mammogram and breast US to rule out breast pathology. -recent thrombocytosis at work screening; repeat platelets today. -recent HPV testing +; obtain further STD screening at this time. -new onset viral URI most likely with chills/sweats/nasalcongestion; treat supportively with rest, fluids, Tylenol or Ibuprofen,and decongestants.   Orders Placed This Encounter  Procedures  . GC/Chlamydia Probe Amp  . DG Chest 2 View    Standing Status:   Future    Number of Occurrences:   1    Standing Expiration Date:   08/09/2017    Order Specific Question:   Reason for Exam (SYMPTOM  OR DIAGNOSIS REQUIRED)    Answer:   R sided chest pain    Order Specific Question:   Is the patient pregnant?    Answer:   No    Order Specific Question:   Preferred imaging location?    Answer:   External  . MM DIAG BREAST TOMO BILATERAL    Standing Status:   Future    Standing Expiration Date:   10/09/2017    Order Specific Question:   Reason for Exam (SYMPTOM  OR DIAGNOSIS REQUIRED)    Answer:   R chest wall pain at 11 o'clock    Order Specific Question:   Is the patient pregnant?    Answer:   No    Order Specific Question:   Preferred imaging location?    Answer:   [facility]  . US BREAST  COMPLETE UNI RIGHT INC AXILLA    Standing Status:   Future    Standing Expiration Date:   10/09/2017    Order Specific Question:   Reason for Exam (SYMPTOM  OR DIAGNOSIS REQUIRED)    Answer:   R chest wall/breast pain at 11 o'clock    Order Specific Question:   Preferred imaging location?    Answer:   [facility]  . CBC with Differential/Platelet  . HIV antibody  . RPR  . POCT urine pregnancy   No orders of the defined types were placed in this encounter.   No Follow-up on file.   Solace Manwarren Elayne Guerin, M.D. [facility] [address] [place],   [address] [phone] phone [phone] fax

## 2016-08-10 LAB — CBC WITH DIFFERENTIAL/PLATELET
BASOS ABS: 0 {cells}/uL (ref 0–200)
Basophils Relative: 0 %
EOS ABS: 125 {cells}/uL (ref 15–500)
Eosinophils Relative: 1 %
HEMATOCRIT: 37.7 % (ref 35.0–45.0)
Hemoglobin: 12.2 g/dL (ref 11.7–15.5)
LYMPHS PCT: 38 %
Lymphs Abs: 4750 cells/uL — ABNORMAL HIGH (ref 850–3900)
MCH: 29.9 pg (ref 27.0–33.0)
MCHC: 32.4 g/dL (ref 32.0–36.0)
MCV: 92.4 fL (ref 80.0–100.0)
MONO ABS: 875 {cells}/uL (ref 200–950)
MONOS PCT: 7 %
MPV: 10 fL (ref 7.5–12.5)
NEUTROS PCT: 54 %
Neutro Abs: 6750 cells/uL (ref 1500–7800)
PLATELETS: 538 10*3/uL — AB (ref 140–400)
RBC: 4.08 MIL/uL (ref 3.80–5.10)
RDW: 12.9 % (ref 11.0–15.0)
WBC: 12.5 10*3/uL — ABNORMAL HIGH (ref 3.8–10.8)

## 2016-08-10 LAB — HIV ANTIBODY (ROUTINE TESTING W REFLEX): HIV 1&2 Ab, 4th Generation: NONREACTIVE

## 2016-08-11 LAB — GC/CHLAMYDIA PROBE AMP
CT PROBE, AMP APTIMA: NOT DETECTED
GC Probe RNA: NOT DETECTED

## 2016-08-11 LAB — RPR

## 2016-09-05 ENCOUNTER — Other Ambulatory Visit: Payer: Self-pay | Admitting: Family Medicine

## 2016-09-06 ENCOUNTER — Other Ambulatory Visit: Payer: Self-pay | Admitting: Family Medicine

## 2016-09-08 NOTE — Telephone Encounter (Signed)
Sent 1 month refill - with note to make appt for annual physical 10/2016 for further refills.

## 2016-09-08 NOTE — Telephone Encounter (Signed)
Refilled on prior request x 1 month

## 2016-10-03 ENCOUNTER — Telehealth: Payer: Self-pay

## 2016-10-03 NOTE — Telephone Encounter (Signed)
Patient concerned because she never got a call about her labs and xray from 07/2016, she only saw them in my chart. Was concerned she wasnot checked for hpv and that's what she asked for specifically because she has a hx of "HPV, non cancer causing" She continues to have chest soreness with movement or throat clearing or cough but denies any sob or cold sx's. Would like to speak with Dr Tamala Julian

## 2016-10-04 NOTE — Telephone Encounter (Signed)
I sent MyChart message to patient regarding STD results and that I did not test for HPV at visit.  See MyChart message for details.

## 2016-10-05 ENCOUNTER — Other Ambulatory Visit: Payer: Self-pay | Admitting: Family Medicine

## 2016-10-06 NOTE — Telephone Encounter (Signed)
Meds ordered this encounter  Medications  . Vitamin D, Ergocalciferol, (DRISDOL) 50000 units CAPS capsule    Sig: TAKE 1 CAPSULE BY MOUTH EVERY 7 DAYS    Dispense:  4 capsule    Refill:  0   Patient notified via My Chart.

## 2016-12-01 NOTE — Addendum Note (Signed)
Addended by: Wardell Honour on: 12/01/2016 05:16 PM   Modules accepted: Orders

## 2017-02-25 ENCOUNTER — Emergency Department (HOSPITAL_COMMUNITY)
Admission: EM | Admit: 2017-02-25 | Discharge: 2017-02-25 | Disposition: A | Discharge status: Home / Self Care | Payer: 59 | Attending: Emergency Medicine | Admitting: Emergency Medicine

## 2017-02-25 ENCOUNTER — Encounter (HOSPITAL_COMMUNITY): Payer: Self-pay | Admitting: Emergency Medicine

## 2017-02-25 ENCOUNTER — Emergency Department (HOSPITAL_COMMUNITY): Payer: 59

## 2017-02-25 DIAGNOSIS — O99611 Diseases of the digestive system complicating pregnancy, first trimester: Secondary | ICD-10-CM | POA: Insufficient documentation

## 2017-02-25 DIAGNOSIS — Z3A01 Less than 8 weeks gestation of pregnancy: Secondary | ICD-10-CM | POA: Insufficient documentation

## 2017-02-25 DIAGNOSIS — K8051 Calculus of bile duct without cholangitis or cholecystitis with obstruction: Secondary | ICD-10-CM | POA: Insufficient documentation

## 2017-02-25 DIAGNOSIS — R109 Unspecified abdominal pain: Secondary | ICD-10-CM

## 2017-02-25 DIAGNOSIS — K8050 Calculus of bile duct without cholangitis or cholecystitis without obstruction: Secondary | ICD-10-CM

## 2017-02-25 LAB — COMPREHENSIVE METABOLIC PANEL
ALT: 15 U/L (ref 14–54)
ANION GAP: 7 (ref 5–15)
AST: 21 U/L (ref 15–41)
Albumin: 2.7 g/dL — ABNORMAL LOW (ref 3.5–5.0)
Alkaline Phosphatase: 73 U/L (ref 38–126)
BUN: <5 mg/dL — ABNORMAL LOW (ref 6–20)
CHLORIDE: 108 mmol/L (ref 101–111)
CO2: 20 mmol/L — AB (ref 22–32)
Calcium: 9 mg/dL (ref 8.9–10.3)
Creatinine, Ser: 0.53 mg/dL (ref 0.44–1.00)
GFR calc non Af Amer: >60 mL/min (ref >60)
Glucose, Bld: 129 mg/dL — ABNORMAL HIGH (ref 65–99)
Potassium: 3.4 mmol/L — ABNORMAL LOW (ref 3.5–5.1)
SODIUM: 135 mmol/L (ref 135–145)
Total Bilirubin: 0.3 mg/dL (ref 0.3–1.2)
Total Protein: 6.6 g/dL (ref 6.5–8.1)

## 2017-02-25 LAB — URINALYSIS, ROUTINE W REFLEX MICROSCOPIC
Bilirubin Urine: NEGATIVE
Glucose, UA: NEGATIVE mg/dL
HGB URINE DIPSTICK: NEGATIVE
Ketones, ur: 5 mg/dL — AB
Leukocytes, UA: NEGATIVE
Nitrite: NEGATIVE
Protein, ur: NEGATIVE mg/dL
SPECIFIC GRAVITY, URINE: 1.021 (ref 1.005–1.030)
pH: 6 (ref 5.0–8.0)

## 2017-02-25 LAB — CBC
HCT: 32.9 % — ABNORMAL LOW (ref 36.0–46.0)
HEMOGLOBIN: 11.2 g/dL — AB (ref 12.0–15.0)
MCH: 30 pg (ref 26.0–34.0)
MCHC: 34 g/dL (ref 30.0–36.0)
MCV: 88.2 fL (ref 78.0–100.0)
Platelets: 409 10*3/uL — ABNORMAL HIGH (ref 150–400)
RBC: 3.73 MIL/uL — AB (ref 3.87–5.11)
RDW: 12.9 % (ref 11.5–15.5)
WBC: 9.3 10*3/uL (ref 4.0–10.5)

## 2017-02-25 LAB — LIPASE, BLOOD: LIPASE: 53 U/L — AB (ref 11–51)

## 2017-02-25 NOTE — Discharge Instructions (Signed)
Your ultrasound showed gallbladder stones. Avoid any spicy and fatty foods. Drink plenty of fluids. Tylenol for any pain. Follow up with general surgery. Return if uncontrolled pain, vomiting, fever, any new concerning symptom.

## 2017-02-25 NOTE — ED Notes (Signed)
Pt reports upper abdominal pain that lasted approx 20 mins prior to arrival, states she took some tylenol, denies pain at this time. Reports she is [redacted] weeks pregnant, per triage RR OB RN made aware and FHT obtained. Pt denies any vaginal bleeding, sob or chest pain.

## 2017-02-25 NOTE — ED Provider Notes (Signed)
[facility] DEPT Provider Note   CSN: [id number] Arrival date & time: 02/25/17  0405     History   Chief Complaint Chief Complaint  Patient presents with  . Abdominal Pain    HPI Bonnie Evans is a 31 y.o. female.  HPI Bonnie Evans is a 31 y.o. female G1P0, approximately [redacted]wks gestation, presents to ED with abdominal pain. States pain started this morning about 56min after waking up. Pain in the upper abdomen, described as squeezing. Reports associated nausea and diaphoresis. Denies lower abdominal pain. No vaginal discharge or bleeding. Denies vomiting. Last BM was yesterday and normal. Took tylenol for pain. States pain eased off after about 20 min. No hx of similar pain in the past. No other complaints.    Past Medical History:  Diagnosis Date  . Anemia   . Genital herpes     Patient Active Problem List   Diagnosis Date Noted  . ANEMIA, IRON DEFICIENCY 10/10/2007  . BACTERIAL VAGINITIS 10/10/2007  . IRREGULAR MENSES 10/10/2007    History reviewed. No pertinent surgical history.  OB History    Gravida Para Term Preterm AB Living   1             SAB TAB Ectopic Multiple Live Births                   Home Medications    Prior to Admission medications   Medication Sig Start Date End Date Taking? Authorizing Provider  Vitamin D, Ergocalciferol, (DRISDOL) 50000 units CAPS capsule TAKE 1 CAPSULE BY MOUTH EVERY 7 DAYS 10/06/16   Harrison Mons, PA-C    Family History Family History  Problem Relation Age of Onset  . Hypertension Mother   . Goiter Mother   . Asthma Brother     Social History Social History  Substance Use Topics  . Smoking status: Never Smoker  . Smokeless tobacco: Never Used  . Alcohol use No     Allergies   Patient has no known allergies.   Review of Systems Review of Systems  Constitutional: Positive for diaphoresis. Negative for chills and fever.  HENT: Positive for congestion.   Respiratory: Negative for cough, chest  tightness and shortness of breath.   Cardiovascular: Negative for chest pain, palpitations and leg swelling.  Gastrointestinal: Positive for abdominal pain and nausea. Negative for diarrhea and vomiting.  Genitourinary: Negative for dysuria, flank pain, pelvic pain, vaginal bleeding, vaginal discharge and vaginal pain.  Musculoskeletal: Negative for arthralgias, myalgias, neck pain and neck stiffness.  Skin: Negative for rash.  Neurological: Negative for dizziness, weakness and headaches.  All other systems reviewed and are negative.    Physical Exam Updated Vital Signs BP (!) 103/59   Pulse 73   Temp 97.9 F (36.6 C) (Oral)   Resp (!) 22   Ht 5\' 4"  (1.626 m)   Wt 97.5 kg (215 lb)   LMP 07/13/2016 (Exact Date)   SpO2 99%   BMI 36.90 kg/m   Physical Exam  Constitutional: She appears well-developed and well-nourished. No distress.  HENT:  Head: Normocephalic.  Eyes: Conjunctivae are normal.  Neck: Neck supple.  Cardiovascular: Normal rate, regular rhythm and normal heart sounds.   Pulmonary/Chest: Effort normal and breath sounds normal. No respiratory distress. She has no wheezes. She has no rales.  Abdominal: Soft. Bowel sounds are normal. She exhibits no distension. There is tenderness. There is no rebound.  RUQ tenderness. Gravid, fundus just below umbilicus  Musculoskeletal: She exhibits no edema.  Neurological: She is alert.  Skin: Skin is warm and dry.  Psychiatric: She has a normal mood and affect. Her behavior is normal.  Nursing note and vitals reviewed.    ED Treatments / Results  Labs (all labs ordered are listed, but only abnormal results are displayed) Labs Reviewed  LIPASE, BLOOD - Abnormal; Notable for the following:       Result Value   Lipase 53 (*)    All other components within normal limits  COMPREHENSIVE METABOLIC PANEL - Abnormal; Notable for the following:    Potassium 3.4 (*)    CO2 20 (*)    Glucose, Bld 129 (*)    BUN <5 (*)    Albumin  2.7 (*)    All other components within normal limits  CBC - Abnormal; Notable for the following:    RBC 3.73 (*)    Hemoglobin 11.2 (*)    HCT 32.9 (*)    Platelets 409 (*)    All other components within normal limits  URINALYSIS, ROUTINE W REFLEX MICROSCOPIC - Abnormal; Notable for the following:    APPearance CLOUDY (*)    Ketones, ur 5 (*)    All other components within normal limits    EKG  EKG Interpretation None       Radiology US Abdomen Limited Ruq  Result Date: 02/25/2017 CLINICAL DATA:  Epigastric cramping EXAM: ULTRASOUND ABDOMEN LIMITED RIGHT UPPER QUADRANT COMPARISON:  None. FINDINGS: Gallbladder: Mild wall thickening measuring 3.1 mm. Multiple stones are identified measuring up to 5 mm. Negative sonographic Murphy's sign. Common bile duct: Diameter: 1.6 mm Liver: No focal lesion identified. Within normal limits in parenchymal echogenicity. IMPRESSION: 1. Gallstones 2. Gallbladder wall thickening. Electronically Signed   By: Kerby Moors M.D.   On: 02/25/2017 07:27    Procedures Procedures (including critical care time)  Medications Ordered in ED Medications - No data to display   Initial Impression / Assessment and Plan / ED Course  I have reviewed the triage vital signs and the nursing notes.  Pertinent labs & imaging results that were available during my care of the patient were reviewed by me and considered in my medical decision making (see chart for details).    Pt is G1P0, 20wks, here with upper abdominal pain this morning which now resolved. TTP in RUQ, will get [place]. FHT 140s. Pt in NAD.   7:53 AM Pt's [place] is positive for gallsones and mild gallbladder wall thickening. Pt is still asymptomatic. No pain. No n/v at this time. Discussed with Dr. Christy Gentles, will give outpatient general surgery referral. Discussed diet choices. Discussed strict return precautions. Pt is comfortable with this plan.   Vitals:   02/25/17 0413 02/25/17 0418 02/25/17 0545  02/25/17 0600  BP: (!) 119/58  (!) 103/59 107/62  Pulse: 82  73 79  Resp: (!) 22     Temp: 97.9 F (36.6 C)     TempSrc: Oral     SpO2: 99% 99% 99% 99%  Weight: 97.5 kg (215 lb)     Height: 5\' 4"  (1.626 m)         Final Clinical Impressions(s) / ED Diagnoses   Final diagnoses:  Abdominal pain    New Prescriptions New Prescriptions   No medications on file     Jeannett Senior, Hershal Coria 02/25/17 Reynolds, Donald, MD 02/25/17 [id number]

## 2017-02-25 NOTE — ED Notes (Signed)
Patient is alert and orientedx4.  Patient was explained discharge instructions and they understood them with no questions.  Bonnie Evans, her husband is taking her home.

## 2017-02-25 NOTE — ED Triage Notes (Signed)
Reports abdominal cramping across top of abdomen that started around 315.  Denies any nausea or vomiting.

## 2017-02-25 NOTE — ED Notes (Signed)
OB rapid response notified.  Reports that patient is not far enough along that we just needed to do a FHT dopper.  Done at this time.  Noted to be 148bpm.

## 2017-03-06 ENCOUNTER — Inpatient Hospital Stay (HOSPITAL_COMMUNITY)
Admission: AD | Admit: 2017-03-06 | Discharge: 2017-03-06 | Discharge status: Against Medical Advice | Payer: 59 | Source: Ambulatory Visit | Attending: Obstetrics and Gynecology | Admitting: Obstetrics and Gynecology

## 2017-03-06 NOTE — MAU Note (Signed)
Pt called, not in lobby.  Did not inform staff she was leaving.

## 2017-03-06 NOTE — MAU Note (Signed)
Pt called, not in lobby 

## 2017-03-06 NOTE — MAU Note (Signed)
Pt not in lobby.  

## 2017-05-30 DIAGNOSIS — A6000 Herpesviral infection of urogenital system, unspecified: Secondary | ICD-10-CM | POA: Insufficient documentation

## 2017-07-05 ENCOUNTER — Inpatient Hospital Stay (HOSPITAL_COMMUNITY): Payer: 59 | Admitting: Anesthesiology

## 2017-07-05 ENCOUNTER — Encounter (HOSPITAL_COMMUNITY): Payer: Self-pay | Admitting: *Deleted

## 2017-07-05 ENCOUNTER — Inpatient Hospital Stay (HOSPITAL_COMMUNITY)
Admission: AD | Admit: 2017-07-05 | Discharge: 2017-07-08 | DRG: 788 | Disposition: A | Discharge status: Home / Self Care | Payer: 59 | Source: Ambulatory Visit | Attending: Obstetrics | Admitting: Obstetrics

## 2017-07-05 ENCOUNTER — Encounter (HOSPITAL_COMMUNITY)
Admission: AD | Disposition: A | Discharge status: Home / Self Care | Payer: 59 | Source: Ambulatory Visit | Attending: Obstetrics

## 2017-07-05 DIAGNOSIS — O99214 Obesity complicating childbirth: Secondary | ICD-10-CM | POA: Diagnosis present

## 2017-07-05 DIAGNOSIS — O9832 Other infections with a predominantly sexual mode of transmission complicating childbirth: Principal | ICD-10-CM | POA: Diagnosis present

## 2017-07-05 DIAGNOSIS — Z3A38 38 weeks gestation of pregnancy: Secondary | ICD-10-CM

## 2017-07-05 DIAGNOSIS — A6000 Herpesviral infection of urogenital system, unspecified: Secondary | ICD-10-CM | POA: Diagnosis present

## 2017-07-05 DIAGNOSIS — O4202 Full-term premature rupture of membranes, onset of labor within 24 hours of rupture: Secondary | ICD-10-CM | POA: Diagnosis present

## 2017-07-05 LAB — CBC
HCT: 30 % — ABNORMAL LOW (ref 36.0–46.0)
HEMOGLOBIN: 9.9 g/dL — AB (ref 12.0–15.0)
MCH: 28.6 pg (ref 26.0–34.0)
MCHC: 33 g/dL (ref 30.0–36.0)
MCV: 86.7 fL (ref 78.0–100.0)
Platelets: 506 10*3/uL — ABNORMAL HIGH (ref 150–400)
RBC: 3.46 MIL/uL — AB (ref 3.87–5.11)
RDW: 14.3 % (ref 11.5–15.5)
WBC: 12.3 10*3/uL — ABNORMAL HIGH (ref 4.0–10.5)

## 2017-07-05 LAB — TYPE AND SCREEN
ABO/RH(D): B POS
ANTIBODY SCREEN: NEGATIVE

## 2017-07-05 LAB — POCT FERN TEST: POCT Fern Test: POSITIVE

## 2017-07-05 SURGERY — Surgical Case
Anesthesia: Spinal | Wound class: Clean Contaminated

## 2017-07-05 MED ORDER — OXYTOCIN 10 UNIT/ML IJ SOLN
INTRAMUSCULAR | Status: AC
  Filled 2017-07-05: qty 4

## 2017-07-05 MED ORDER — SOD CITRATE-CITRIC ACID 500-334 MG/5ML PO SOLN
30.0000 mL | Freq: Once | ORAL | Status: AC
  Administered 2017-07-05: 30 mL via ORAL
  Filled 2017-07-05: qty 15

## 2017-07-05 MED ORDER — CEFAZOLIN SODIUM-DEXTROSE 2-3 GM-%(50ML) IV SOLR
INTRAVENOUS | Status: AC
  Filled 2017-07-05: qty 50

## 2017-07-05 MED ORDER — SCOPOLAMINE 1 MG/3DAYS TD PT72
MEDICATED_PATCH | TRANSDERMAL | Status: AC
  Filled 2017-07-05: qty 1

## 2017-07-05 MED ORDER — FENTANYL CITRATE (PF) 100 MCG/2ML IJ SOLN
INTRAMUSCULAR | Status: AC
  Filled 2017-07-05: qty 2

## 2017-07-05 MED ORDER — FAMOTIDINE IN NACL 20-0.9 MG/50ML-% IV SOLN
20.0000 mg | Freq: Once | INTRAVENOUS | Status: AC
  Administered 2017-07-05: 20 mg via INTRAVENOUS
  Filled 2017-07-05: qty 50

## 2017-07-05 MED ORDER — FENTANYL CITRATE (PF) 100 MCG/2ML IJ SOLN
100.0000 ug | Freq: Once | INTRAMUSCULAR | Status: AC
  Administered 2017-07-05: 100 ug via INTRAVENOUS
  Filled 2017-07-05: qty 2

## 2017-07-05 MED ORDER — CEFAZOLIN SODIUM-DEXTROSE 2-4 GM/100ML-% IV SOLN
2.0000 g | INTRAVENOUS | Status: AC
  Administered 2017-07-05: 2 g via INTRAVENOUS
  Filled 2017-07-05: qty 100

## 2017-07-05 MED ORDER — ONDANSETRON HCL 4 MG/2ML IJ SOLN
INTRAMUSCULAR | Status: AC
  Filled 2017-07-05: qty 2

## 2017-07-05 MED ORDER — BUPIVACAINE IN DEXTROSE 0.75-8.25 % IT SOLN
INTRATHECAL | Status: AC
  Filled 2017-07-05: qty 2

## 2017-07-05 MED ORDER — LACTATED RINGERS IV BOLUS (SEPSIS)
1000.0000 mL | Freq: Once | INTRAVENOUS | Status: AC
  Administered 2017-07-05: 1000 mL via INTRAVENOUS

## 2017-07-05 MED ORDER — LACTATED RINGERS IV SOLN
INTRAVENOUS | Status: DC
  Administered 2017-07-05 – 2017-07-06 (×3): via INTRAVENOUS

## 2017-07-05 MED ORDER — SCOPOLAMINE 1 MG/3DAYS TD PT72
MEDICATED_PATCH | TRANSDERMAL | Status: DC | PRN
  Administered 2017-07-05: 1 via TRANSDERMAL

## 2017-07-05 MED ORDER — PHENYLEPHRINE 8 MG IN D5W 100 ML (0.08MG/ML) PREMIX OPTIME: INJECTION | INTRAVENOUS | Status: DC | PRN

## 2017-07-05 MED ORDER — LACTATED RINGERS IV SOLN: INTRAVENOUS | Status: DC | PRN

## 2017-07-05 MED ORDER — SCOPOLAMINE 1 MG/3DAYS TD PT72: MEDICATED_PATCH | TRANSDERMAL | Status: DC | PRN

## 2017-07-05 MED ORDER — MORPHINE SULFATE (PF) 0.5 MG/ML IJ SOLN
INTRAMUSCULAR | Status: AC
  Filled 2017-07-05: qty 10

## 2017-07-05 MED ORDER — PHENYLEPHRINE 8 MG IN D5W 100 ML (0.08MG/ML) PREMIX OPTIME
INJECTION | INTRAVENOUS | Status: DC | PRN
  Administered 2017-07-05: 60 ug/min via INTRAVENOUS

## 2017-07-05 MED ORDER — DEXAMETHASONE SODIUM PHOSPHATE 10 MG/ML IJ SOLN
INTRAMUSCULAR | Status: AC
  Filled 2017-07-05: qty 1

## 2017-07-05 SURGICAL SUPPLY — 37 items
APL SKNCLS STERI-STRIP NONHPOA (GAUZE/BANDAGES/DRESSINGS) ×1
BENZOIN TINCTURE PRP APPL 2/3 (GAUZE/BANDAGES/DRESSINGS) ×3 IMPLANT
CHLORAPREP W/TINT 26ML (MISCELLANEOUS) ×3 IMPLANT
CLAMP CORD UMBIL (MISCELLANEOUS) IMPLANT
CLOSURE STERI STRIP 1/2 X4 (GAUZE/BANDAGES/DRESSINGS) ×2 IMPLANT
CLOTH BEACON ORANGE TIMEOUT ST (SAFETY) ×3 IMPLANT
DRSG OPSITE POSTOP 4X10 (GAUZE/BANDAGES/DRESSINGS) ×3 IMPLANT
ELECT REM PT RETURN 9FT ADLT (ELECTROSURGICAL) ×3
ELECTRODE REM PT RTRN 9FT ADLT (ELECTROSURGICAL) ×1 IMPLANT
EXTRACTOR VACUUM KIWI (MISCELLANEOUS) IMPLANT
GLOVE BIOGEL PI IND STRL 6.5 (GLOVE) ×1 IMPLANT
GLOVE BIOGEL PI IND STRL 7.0 (GLOVE) ×1 IMPLANT
GLOVE BIOGEL PI INDICATOR 6.5 (GLOVE) ×2
GLOVE BIOGEL PI INDICATOR 7.0 (GLOVE) ×2
GLOVE ECLIPSE 6.0 STRL STRAW (GLOVE) ×3 IMPLANT
GOWN STRL REUS W/TWL LRG LVL3 (GOWN DISPOSABLE) ×6 IMPLANT
KIT ABG SYR 3ML LUER SLIP (SYRINGE) IMPLANT
NDL HYPO 25X5/8 SAFETYGLIDE (NEEDLE) IMPLANT
NEEDLE HYPO 25X5/8 SAFETYGLIDE (NEEDLE) IMPLANT
NS IRRIG 1000ML POUR BTL (IV SOLUTION) ×3 IMPLANT
PACK C SECTION WH (CUSTOM PROCEDURE TRAY) ×3 IMPLANT
PAD OB MATERNITY 4.3X12.25 (PERSONAL CARE ITEMS) ×3 IMPLANT
PENCIL SMOKE EVAC W/HOLSTER (ELECTROSURGICAL) ×3 IMPLANT
RTRCTR C-SECT PINK 25CM LRG (MISCELLANEOUS) ×3 IMPLANT
STRIP CLOSURE SKIN 1/2X4 (GAUZE/BANDAGES/DRESSINGS) ×2 IMPLANT
SUT MNCRL 0 VIOLET CTX 36 (SUTURE) ×2 IMPLANT
SUT MNCRL AB 3-0 PS2 27 (SUTURE) ×3 IMPLANT
SUT MONOCRYL 0 CTX 36 (SUTURE) ×6
SUT PLAIN 0 NONE (SUTURE) IMPLANT
SUT PLAIN 2 0 (SUTURE) ×3
SUT PLAIN ABS 2-0 CT1 27XMFL (SUTURE) ×1 IMPLANT
SUT VIC AB 0 CTX 36 (SUTURE) ×6
SUT VIC AB 0 CTX36XBRD ANBCTRL (SUTURE) ×2 IMPLANT
SUT VIC AB 2-0 CT1 27 (SUTURE) ×3
SUT VIC AB 2-0 CT1 TAPERPNT 27 (SUTURE) ×1 IMPLANT
TOWEL OR 17X24 6PK STRL BLUE (TOWEL DISPOSABLE) ×3 IMPLANT
TRAY FOLEY BAG SILVER LF 14FR (SET/KITS/TRAYS/PACK) ×3 IMPLANT

## 2017-07-05 NOTE — Anesthesia Procedure Notes (Signed)
Spinal  Patient location during procedure: OR Staffing Anesthesiologist: Lyndle Herrlich Spinal Block Patient position: sitting Prep: DuraPrep Patient monitoring: heart rate, blood pressure and continuous pulse ox Approach: right paramedian Location: L3-4 Injection technique: single-shot Needle Needle type: Sprotte  Needle gauge: 24 G Needle length: 9 cm Assessment Sensory level: T4 Additional Notes Spinal Dosage in OR  .75% Bupivicaine ml       1.4     [id number]   mcg        100    Fentanyl mcg            25

## 2017-07-05 NOTE — MAU Note (Signed)
PT  SAYS [facility] AT [place].    VE IN  OFFICE  TODAY  1 CM.    UNSURE  OF  GBS

## 2017-07-05 NOTE — Anesthesia Preprocedure Evaluation (Signed)
Anesthesia Evaluation  Patient identified by MRN, date of birth, ID band Patient awake    Reviewed: Allergy & Precautions, H&P , Patient's Chart, lab work & pertinent test results, reviewed documented beta blocker date and time   Airway Mallampati: II  TM Distance: >3 FB Neck ROM: full    Dental no notable dental hx.    Pulmonary    Pulmonary exam normal breath sounds clear to auscultation       Cardiovascular  Rhythm:regular Rate:Normal     Neuro/Psych    GI/Hepatic   Endo/Other  Morbid obesity  Renal/GU      Musculoskeletal   Abdominal   Peds  Hematology   Anesthesia Other Findings   Reproductive/Obstetrics                             Anesthesia Physical Anesthesia Plan  ASA: III and emergent  Anesthesia Plan: Spinal   Post-op Pain Management:    Induction:   PONV Risk Score and Plan:   Airway Management Planned:   Additional Equipment:   Intra-op Plan:   Post-operative Plan:   Informed Consent: I have reviewed the patients History and Physical, chart, labs and discussed the procedure including the risks, benefits and alternatives for the proposed anesthesia with the patient or authorized representative who has indicated his/her understanding and acceptance.   Dental Advisory Given  Plan Discussed with: CRNA and Surgeon  Anesthesia Plan Comments: (  )        Anesthesia Quick Evaluation

## 2017-07-05 NOTE — H&P (Addendum)
31 y.o. G1P0 @ [redacted]w[redacted]d presents with ROM at 1900.  She is ruled in by a positive fern and pooling on exam.  She has a history of genital herpes.  She has been on valtrex but reports an outbreak that started last Friday with the typical symptoms of burning / vulvar discomfort.  There were 2 areas of pain on the labia minora.  She was still symptomatic this weekend but has been pain free for 3-4 days.  Otherwise has good fetal movement and no bleeding.  Past Medical History:  Diagnosis Date  . Anemia   . Genital herpes     Past Surgical History:  Procedure Laterality Date  . NO PAST SURGERIES      OB History  Gravida Para Term Preterm AB Living  1            SAB TAB Ectopic Multiple Live Births               # Outcome Date GA Lbr Len/2nd Weight Sex Delivery Anes PTL Lv  1 Current               Social History   Social History  . Marital status: Married    Spouse name: N/A  . Number of children: N/A  . Years of education: N/A   Occupational History  . Not on file.   Social History Main Topics  . Smoking status: Never Smoker  . Smokeless tobacco: Never Used  . Alcohol use No  . Drug use: No  . Sexual activity: Yes    Birth control/ protection: None   Other Topics Concern  . Not on file   Patient has no known allergies.    Prenatal Transfer Tool  Maternal Diabetes: No Genetic Screening: Normal Maternal Ultrasounds/Referrals: Normal Fetal Ultrasounds or other Referrals:  None Maternal Substance Abuse:  No Significant Maternal Medications:  Meds include: Other:  valtrex Significant Maternal Lab Results: None  ABO, Rh:  B+ Antibody:  Neg Rubella:  Immune RPR: NON REAC (11/29 1639)  HBsAg:   Neg HIV: NONREACTIVE (11/29 1639)  GBS:   Neg    Vitals:   07/05/17 2011 07/05/17 2015  BP:  137/82  Pulse:  88  Resp:    Temp:    SpO2: 98%      General:  NAD Abdomen:  soft, gravid Ex:  1+ edema Perineal / SSE; vulva without lesions.  Vagina with copious clear  fluid, cervix and upper vagina not well visualized.  Lower vagina without lesions SVE:  1/80 per RN FHTs:  140s, mod var, no decel Toco:  q3-5   A/P   31 y.o. G1P0 [redacted]w[redacted]d presents with ROM at term Genital herpes--on prophylaxis but outbreak < 7d ago.  We discussed the risk of transmission to baby w active recurrent outbreak.  We discussed that there is clear benefit to cesarean delivery in presence of active lesions or prodromal symptoms.  We also discussed that cesarean delivery is not recommended only for a history of genital herpes.  We discussed that there is no clear recommendation for mode of delivery with "recent" outbreak, but as she was symptomatic with lesions < 7d ago, she is likely still having an increased level of viral shedding compared to baseline.  She desires to proceed w primary cesarean delivery.  Discussed risks to include infection, bleeding, damage to surrounding structures (included but not limited to bladder, bowel, nerves, vessels, tubes, ovaries, baby), need blood transfusion, risk of vte/pe, need for additional  surgical procedures.  Consent signed.   Ancef 2gm on call to [place]

## 2017-07-06 ENCOUNTER — Encounter (HOSPITAL_COMMUNITY): Payer: Self-pay | Admitting: Neonatal-Perinatal Medicine

## 2017-07-06 DIAGNOSIS — O4202 Full-term premature rupture of membranes, onset of labor within 24 hours of rupture: Secondary | ICD-10-CM | POA: Diagnosis present

## 2017-07-06 LAB — ABO/RH: ABO/RH(D): B POS

## 2017-07-06 LAB — CBC
HEMATOCRIT: 28.2 % — AB (ref 36.0–46.0)
HEMOGLOBIN: 9.4 g/dL — AB (ref 12.0–15.0)
MCH: 29 pg (ref 26.0–34.0)
MCHC: 33.3 g/dL (ref 30.0–36.0)
MCV: 87 fL (ref 78.0–100.0)
Platelets: 505 10*3/uL — ABNORMAL HIGH (ref 150–400)
RBC: 3.24 MIL/uL — ABNORMAL LOW (ref 3.87–5.11)
RDW: 14.3 % (ref 11.5–15.5)
WBC: 20.4 10*3/uL — ABNORMAL HIGH (ref 4.0–10.5)

## 2017-07-06 LAB — HEPATITIS B SURFACE ANTIGEN: HEP B S AG: NEGATIVE

## 2017-07-06 LAB — RPR: RPR: NONREACTIVE

## 2017-07-06 MED ORDER — COCONUT OIL OIL: 1.0000 "application " | TOPICAL_OIL | Status: DC | PRN

## 2017-07-06 MED ORDER — DEXAMETHASONE SODIUM PHOSPHATE 10 MG/ML IJ SOLN
INTRAMUSCULAR | Status: DC | PRN
  Administered 2017-07-06: 10 mg via INTRAVENOUS

## 2017-07-06 MED ORDER — SIMETHICONE 80 MG PO CHEW: 80.0000 mg | CHEWABLE_TABLET | ORAL | Status: DC | PRN

## 2017-07-06 MED ORDER — OXYCODONE HCL 5 MG PO TABS: 10.0000 mg | ORAL_TABLET | ORAL | Status: DC | PRN

## 2017-07-06 MED ORDER — DIBUCAINE 1 % RE OINT: 1.0000 "application " | TOPICAL_OINTMENT | RECTAL | Status: DC | PRN

## 2017-07-06 MED ORDER — DIPHENHYDRAMINE HCL 25 MG PO CAPS
25.0000 mg | ORAL_CAPSULE | Freq: Four times a day (QID) | ORAL | Status: DC | PRN
  Administered 2017-07-06: 25 mg via ORAL
  Filled 2017-07-06: qty 1

## 2017-07-06 MED ORDER — FENTANYL CITRATE (PF) 100 MCG/2ML IJ SOLN: 25.0000 ug | INTRAMUSCULAR | Status: DC | PRN

## 2017-07-06 MED ORDER — PRENATAL MULTIVITAMIN CH
1.0000 | ORAL_TABLET | Freq: Every day | ORAL | Status: DC
Start: 2017-07-06 — End: 2017-07-08
  Administered 2017-07-06 – 2017-07-08 (×3): 1 via ORAL
  Filled 2017-07-06 (×3): qty 1

## 2017-07-06 MED ORDER — TETANUS-DIPHTH-ACELL PERTUSSIS 5-2.5-18.5 LF-MCG/0.5 IM SUSP: 0.5000 mL | Freq: Once | INTRAMUSCULAR | Status: DC

## 2017-07-06 MED ORDER — MORPHINE SULFATE (PF) 0.5 MG/ML IJ SOLN
INTRAMUSCULAR | Status: AC
  Filled 2017-07-06: qty 10

## 2017-07-06 MED ORDER — IBUPROFEN 600 MG PO TABS
600.0000 mg | ORAL_TABLET | Freq: Four times a day (QID) | ORAL | Status: DC
  Administered 2017-07-06 – 2017-07-08 (×8): 600 mg via ORAL
  Filled 2017-07-06 (×8): qty 1

## 2017-07-06 MED ORDER — WITCH HAZEL-GLYCERIN EX PADS: 1.0000 "application " | MEDICATED_PAD | CUTANEOUS | Status: DC | PRN

## 2017-07-06 MED ORDER — ONDANSETRON HCL 4 MG/2ML IJ SOLN
INTRAMUSCULAR | Status: DC | PRN
  Administered 2017-07-06: 4 mg via INTRAVENOUS

## 2017-07-06 MED ORDER — SIMETHICONE 80 MG PO CHEW
80.0000 mg | CHEWABLE_TABLET | Freq: Three times a day (TID) | ORAL | Status: DC
Start: 2017-07-06 — End: 2017-07-08
  Administered 2017-07-06 – 2017-07-07 (×5): 80 mg via ORAL
  Filled 2017-07-06 (×5): qty 1

## 2017-07-06 MED ORDER — OXYTOCIN 10 UNIT/ML IJ SOLN
INTRAVENOUS | Status: DC | PRN
  Administered 2017-07-06: 40 [IU] via INTRAVENOUS

## 2017-07-06 MED ORDER — ACETAMINOPHEN 325 MG PO TABS
650.0000 mg | ORAL_TABLET | ORAL | Status: DC | PRN
  Administered 2017-07-07: 650 mg via ORAL
  Filled 2017-07-06: qty 2

## 2017-07-06 MED ORDER — SIMETHICONE 80 MG PO CHEW
80.0000 mg | CHEWABLE_TABLET | ORAL | Status: DC
  Administered 2017-07-07 – 2017-07-08 (×2): 80 mg via ORAL
  Filled 2017-07-06 (×2): qty 1

## 2017-07-06 MED ORDER — SODIUM CHLORIDE 0.9 % IR SOLN
Status: DC | PRN
  Administered 2017-07-06: 1

## 2017-07-06 MED ORDER — MENTHOL 3 MG MT LOZG: 1.0000 | LOZENGE | OROMUCOSAL | Status: DC | PRN

## 2017-07-06 MED ORDER — LACTATED RINGERS IV SOLN
INTRAVENOUS | Status: DC
  Administered 2017-07-06: 19:00:00 via INTRAVENOUS

## 2017-07-06 MED ORDER — KETOROLAC TROMETHAMINE 30 MG/ML IJ SOLN
30.0000 mg | Freq: Once | INTRAMUSCULAR | Status: AC
  Administered 2017-07-06: 30 mg via INTRAMUSCULAR

## 2017-07-06 MED ORDER — OXYTOCIN 40 UNITS IN LACTATED RINGERS INFUSION - SIMPLE MED: 2.5000 [IU]/h | INTRAVENOUS | Status: AC

## 2017-07-06 MED ORDER — OXYCODONE HCL 5 MG PO TABS
5.0000 mg | ORAL_TABLET | ORAL | Status: DC | PRN
  Administered 2017-07-06 – 2017-07-08 (×5): 5 mg via ORAL
  Filled 2017-07-06 (×5): qty 1

## 2017-07-06 MED ORDER — KETOROLAC TROMETHAMINE 30 MG/ML IJ SOLN
INTRAMUSCULAR | Status: AC
  Administered 2017-07-06: 30 mg via INTRAMUSCULAR
  Filled 2017-07-06: qty 1

## 2017-07-06 MED ORDER — SENNOSIDES-DOCUSATE SODIUM 8.6-50 MG PO TABS
2.0000 | ORAL_TABLET | ORAL | Status: DC
  Administered 2017-07-07 – 2017-07-08 (×2): 2 via ORAL
  Filled 2017-07-06 (×2): qty 2

## 2017-07-06 MED ORDER — LACTATED RINGERS IV SOLN
INTRAVENOUS | Status: DC | PRN
  Administered 2017-07-06: via INTRAVENOUS

## 2017-07-06 NOTE — Lactation Note (Signed)
This note was copied from a baby's chart. Lactation Consultation Note  Patient Name: Boy Milyn Stapleton [id number] Date: 07/06/2017 Reason for consult: Initial assessment Baby at 12 hr of life. Upon entry a sleeping baby was sts with mom. Attempted to latch baby but he was sleepy and mom was ready to eat her lunch. Discussed baby behavior, feeding frequency, pumping, risks of formula/artifical nipples, baby belly size, voids, wt loss, breast changes, not latching baby on an active hsv outbreak of the nipple, and nipple care. Mom stated she can manually express and has a spoon in the room. There a few large drops of colostrum in the flange of the DEBP. Given lactation handouts. Aware of OP services and support group.     Maternal Data Does the patient have breastfeeding experience prior to this delivery?: No  Feeding Feeding Type: Breast Fed Length of feed: 0 min  LATCH Score Latch: Too sleepy or reluctant, no latch achieved, no sucking elicited.                 Interventions    Lactation Tools Discussed/Used WIC Program: No   Consult Status Consult Status: Follow-up Date: 07/07/17 Follow-up type: In-patient    Denzil Hughes 07/06/2017, 12:37 PM

## 2017-07-06 NOTE — Anesthesia Postprocedure Evaluation (Signed)
Anesthesia Post Note  Patient: Bonnie Evans  Procedure(s) Performed: CESAREAN SECTION (N/A )     Patient location during evaluation: Mother Baby Anesthesia Type: Spinal Level of consciousness: awake, awake and alert and oriented Pain management: pain level controlled Vital Signs Assessment: post-procedure vital signs reviewed and stable Respiratory status: spontaneous breathing, nonlabored ventilation and respiratory function stable Cardiovascular status: stable Postop Assessment: no headache, no backache, no apparent nausea or vomiting, adequate PO intake and patient able to bend at knees Anesthetic complications: no    Last Vitals:  Vitals:   07/06/17 0510 07/06/17 0620  BP: 131/75 125/69  Pulse: 79 88  Resp: 18 18  Temp:    SpO2: 97% 95%    Last Pain:  Vitals:   07/06/17 0620  TempSrc:   PainSc: 0-No pain   Pain Goal: Patients Stated Pain Goal: 0 (07/05/17 2253)               Zophia Marrone

## 2017-07-06 NOTE — Op Note (Signed)
Cesarean Section Procedure Note  Pre-operative Diagnosis: 1. Rupture of membranes at [redacted]w[redacted]d  2. Active herpes outbreak  Post-operative Diagnosis: same as above  Surgeon: Jerelyn Charles, MD  Procedure: Primary low transverse cesarean section   Anesthesia: Spinal anesthesia  Estimated Blood Loss: 589 mL         Drains: Foley catheter         Specimens: Placenta to L&D              Complications:  None; patient tolerated the procedure well.         Disposition: PACU - hemodynamically stable.  Findings:  Normal uterus, tubes and ovaries bilaterally.  Viable female infant, weight pending, see delivery summary, Apgars 8, 9.    Procedure Details   After spinal anesthesia was found to adequate , the patient was placed in the dorsal supine position with a leftward tilt, draped and prepped in the usual sterile manner. A Pfannenstiel incision was made and carried down through the subcutaneous tissue to the fascia. The fascia was incised in the midline and the fascial incision was extended laterally with Mayo scissors. The superior aspect of the fascial incision was grasped with two Kocher clamp, tented up and the rectus muscles dissected off sharply. The rectus was then dissected off with blunt dissection and Mayo scissors inferiorly. The rectus muscles were separated in the midline. The abdominal peritoneum was identified, tented up, entered sharply, and the incision was extended superiorly and inferiorly with good visualization of the bladder. The Alexis retractor was deployed. The vesicouterine peritoneum was identified, tented up, entered sharply, and the bladder flap was created digitally. Scalpel was then used to make a low transverse incision on the uterus which was extended in the cephalad-caudad direction with blunt dissection. The fluid was clear. The fetal vertex was identified, elevated out of the pelvis and brought to the hysterotomy. The head was delivered easily followed by the shoulders  and body. The cord was clamped and cut and the infant was passed to the waiting neonatologist. Placenta was then delivered spontaneously, intact and appear normal, the uterus was cleared of all clot and debris   The hysterotomy was repaired with #0 Monocryl in running locked fashion.  A second imbricating layer of #0 Monocryl was placed. The hysterotomy was reexamined and excellent hemostasis was noted.  The Alexis retractor was removed from the abdomen. The peritoneum was examined and all vessels noted to be hemostatic. The abdominal cavity was cleared of all clot and debris.  The peritoneum was closed with 2-0 vicryl in a running fashion. The fascia and rectus muscles were inspected and were hemostatic. The fascia was closed with 0 Vicryl in a running fashion. The subcuticular layer was irrigated and all bleeders cauterized.  The subcutaneous layer was re approximated with interrupted 3-0 plain gut.  The skin was closed with 3-0 monocryl in a subcuticular fashion. The incision was dressed with benzoine, steri strips and honeycomb dressing. All sponge lap and needle counts were correct x3. Patient tolerated the procedure well and recovered in stable condition following the procedure.

## 2017-07-06 NOTE — Progress Notes (Signed)
Subjective: Postpartum Day 0: Cesarean Delivery Patient reports pain controlled, no nausea or vomiting.  Objective: Vital signs in last 24 hours: Temp:  [98.2 F (36.8 C)-99.1 F (37.3 C)] 98.4 F (36.9 C) (10/26 1430) Pulse Rate:  [62-92] 71 (10/26 1430) Resp:  [10-23] 20 (10/26 1430) BP: (108-155)/(62-88) 126/62 (10/26 1430) SpO2:  [95 %-100 %] 99 % (10/26 1430) Weight:  [108 kg (238 lb)] 108 kg (238 lb) (10/25 2121)  Physical Exam:  General: alert, cooperative and appears stated age Lochia: appropriate Uterine Fundus: firm Incision: healing well DVT Evaluation: No evidence of DVT seen on physical exam.   Recent Labs  07/05/17 2120 07/06/17 0631  HGB 9.9* 9.4*  HCT 30.0* 28.2*    Assessment/Plan: Status post Cesarean section. Doing well postoperatively.  Continue current care.  Marykay Mccleod H. 07/06/2017, 5:52 PM

## 2017-07-06 NOTE — Transfer of Care (Signed)
Immediate Anesthesia Transfer of Care Note  Patient: Bonnie Evans  Procedure(s) Performed: CESAREAN SECTION (N/A )  Patient Location: PACU  Anesthesia Type:Spinal  Level of Consciousness: awake, alert  and oriented  Airway & Oxygen Therapy: Patient Spontanous Breathing  Post-op Assessment: Report given to RN and Post -op Vital signs reviewed and stable  Post vital signs: Reviewed and stable  Last Vitals:  Vitals:   07/05/17 2011 07/05/17 2015  BP:  137/82  Pulse:  88  Resp:    Temp:    SpO2: 98%     Last Pain:  Vitals:   07/05/17 2253  PainSc: 8       Patients Stated Pain Goal: 0 (44/03/47 4259)  Complications: No apparent anesthesia complications

## 2017-07-06 NOTE — Brief Op Note (Signed)
07/05/2017 - 07/06/2017  12:51 AM  PATIENT:  Bonnie Evans  31 y.o. female  PRE-OPERATIVE DIAGNOSIS:  Active herpes outbreak  POST-OPERATIVE DIAGNOSIS:  active herpes outbreak  PROCEDURE:  Procedure(s): CESAREAN SECTION (N/A)  SURGEON:  Surgeon(s) and Role:    Jerelyn Charles, MD - Primary  ANESTHESIA:   spinal  EBL:  589 mL   BLOOD ADMINISTERED:none  DRAINS: none   LOCAL MEDICATIONS USED:  NONE  SPECIMEN:  No Specimen  DISPOSITION OF SPECIMEN:  N/A  COUNTS:  YES  TOURNIQUET:  * No tourniquets in log *  DICTATION: .Note written in EPIC  PLAN OF CARE: Admit to inpatient   PATIENT DISPOSITION:  PACU - hemodynamically stable.   Delay start of Pharmacological VTE agent (>24hrs) due to surgical blood loss or risk of bleeding: not applicable

## 2017-07-07 ENCOUNTER — Encounter (HOSPITAL_COMMUNITY): Payer: Self-pay | Admitting: *Deleted

## 2017-07-07 MED ORDER — SUCROSE 24% NICU/PEDS ORAL SOLUTION: 0.5000 mL | OROMUCOSAL | Status: DC | PRN

## 2017-07-07 MED ORDER — ACETAMINOPHEN FOR CIRCUMCISION 160 MG/5 ML
40.0000 mg | ORAL | Status: DC | PRN
  Filled 2017-07-07: qty 1.25

## 2017-07-07 MED ORDER — ACETAMINOPHEN FOR CIRCUMCISION 160 MG/5 ML
40.0000 mg | Freq: Once | ORAL | Status: DC
  Filled 2017-07-07: qty 1.25

## 2017-07-07 MED ORDER — LIDOCAINE 1% INJECTION FOR CIRCUMCISION
0.8000 mL | INJECTION | Freq: Once | INTRAVENOUS | Status: DC
  Filled 2017-07-07: qty 1

## 2017-07-07 MED ORDER — EPINEPHRINE TOPICAL FOR CIRCUMCISION 0.1 MG/ML
1.0000 [drp] | TOPICAL | Status: DC | PRN
  Filled 2017-07-07: qty 0.05

## 2017-07-07 NOTE — Progress Notes (Signed)
Subjective: Postpartum Day 1: Cesarean Delivery Patient reports pain controlled, denies nausea or vomiting. Ambulation and voiding without difficulty  Objective: Vital signs in last 24 hours: Temp:  [98 F (36.7 C)-98.4 F (36.9 C)] 98.4 F (36.9 C) (10/27 0641) Pulse Rate:  [66-79] 73 (10/27 0641) Resp:  [18-20] 18 (10/27 0641) BP: (107-140)/(57-67) 122/59 (10/27 0641) SpO2:  [98 %-99 %] 98 % (10/26 1837)  Physical Exam:  General: alert, cooperative and appears stated age 31: appropriate Uterine Fundus: firm Incision: healing well DVT Evaluation: No evidence of DVT seen on physical exam.   Recent Labs  07/05/17 2120 07/06/17 0631  HGB 9.9* 9.4*  HCT 30.0* 28.2*    Assessment/Plan: Status post Cesarean section. Doing well postoperatively.  Continue current care. Desires neonatal circumcision, R/B/A of procedure discussed at length. Pt understands that neonatal circumcision is not considered medically necessary and is elective. The risks include, but are not limited to bleeding, infection, damage to the penis, development of scar tissue, and having to have it redone at a later date. Pt understands theses risks and wishes to proceed   Haisley Arens H. 07/07/2017, 11:22 AM

## 2017-07-07 NOTE — Lactation Note (Signed)
This note was copied from a baby's chart. Lactation Consultation Note  Patient Name: Bonnie Evans [id number] Date: 07/07/2017  Randel Books is 28 hours old,  LC updated doc flow sheets per mom.  Per mom recently breast fed around 1330 .  LC discussed calling on the nurses light for feeding assessment and LATCH scoring.  LC mentioned to mom that a RN / or LC can assess the feeding and do a latch score.  Per mom comfortable with latch.       Maternal Data    Feeding Feeding Type: Breast Milk Length of feed: 15 min (pe rmom , lots of swallows )  LATCH Score                   Interventions    Lactation Tools Discussed/Used     Consult Status      Bonnie Evans 07/07/2017, 2:50 PM

## 2017-07-07 NOTE — Progress Notes (Signed)
CSW attempted to meet with MOB at bedside; however, MOB was accompanied by many visitors. CSW will attempt at a later time.   Anielle Headrick, MSW, LCSW-A Clinical Social Worker  [facility]  Office: [phone]

## 2017-07-08 MED ORDER — OXYCODONE-ACETAMINOPHEN 5-325 MG PO TABS: 1.0000 | ORAL_TABLET | ORAL | 0 refills | Status: DC | PRN

## 2017-07-08 MED ORDER — DOCUSATE SODIUM 100 MG PO CAPS: 100.0000 mg | ORAL_CAPSULE | Freq: Two times a day (BID) | ORAL | 0 refills | Status: DC

## 2017-07-08 MED ORDER — IBUPROFEN 600 MG PO TABS: 600.0000 mg | ORAL_TABLET | Freq: Four times a day (QID) | ORAL | 0 refills | Status: DC | PRN

## 2017-07-08 NOTE — Clinical Social Work Maternal (Signed)
CLINICAL SOCIAL WORK MATERNAL/CHILD NOTE  Patient Details  Name: Bonnie Evans MRN: [id number] Date of Birth: Jul 09, 1986  Date:  07/08/2017  Clinical Social Worker Initiating Note:  Ferdinand Lango Rihana Kiddy, MSW, LCSW-A Date/Time: Initiated:  07/08/17/0915     Child's Name:  Bonnie Evans    Biological Parents:  Mother, Father Raela Bohl and Bonnie Evans )   Need for Interpreter:  None   Reason for Referral:  Current Substance Use/Substance Use During Pregnancy , Other (Comment) (Edinburgh scale score of 10)   Address:  [address] 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   Strengths:  Ability to meet basic needs , Compliance with medical plan , Home prepared for child    Psychotropic Medications:         Pediatrician:       Pediatrician List:   [facility]      Pediatrician Fax Number:    Risk Factors/Current Problems:  Substance Use    Cognitive State:  Alert , Able to Concentrate , Insightful , Goal Oriented    Mood/Affect:  Calm , Comfortable , Interested    CSW Assessment: CSW met with MOB at bedside to complete assessment for consult  regarding hx of THC use in pregnancy and for [place] Scale Score of 10. Upon this writers arrival, MOB was breast feeding baby while FOB was watching TV. This Probation officer explained role and reasoning for visit. MOB was warm and welcoming noting it was ok to continue with assessment with FOB present. This Probation officer congratulated MOB on baby's arrival. MOB was thankful. CSW inquired about MOB's feeling during pregnancy, delivery and now. MOB notes she felt good during pregnancy and pre and post delivery. She notes she is going to feel better once her incision heals up completely. This Probation officer encouraged MOB to get up and walk as this will help her incision feel better and is good for the healing process. MOB was thankful for the tip.   CSW provided education regarding Baby Blues vs PMADs and provided MOB with information about support groups held at [place] encouraged MOB to evaluate her mental health throughout the postpartum period with the use of the New Mom Checklist developed by Postpartum Progress and notify a medical professional if symptoms arise. MOB acknowledges she has prior knowledge of PPD but  was thankful for the additional information.   CSW discussed the hospitals policy and procedure regarding drug exposed newborns. CSW informed MOB that a UDS and CDS were collected on baby due to her disclosing during [facility] that she uses THC daily. CSW informed MOB that babys UDS is negative; however CDS is still pending. CSW explained if CDS results are (+) for substance, this Probation officer will make a report to [place]. MOB noted understanding.   CSW assessed for any additional psycho-social needs. MOB discloses none at this time.   CSW identifies no further need for intervention at this time or barriers to discharge.   CSW Plan/Description:  No Further Intervention Required/No Barriers to Discharge, CSW Will Continue to Monitor Umbilical Cord Tissue Drug Screen  Results and Make Report if [facility], [place], Perinatal Mood and Anxiety Disorder (PMADs) Education, Sudden Infant Death Syndrome (SIDS) Education   Water quality scientist, MSW, [facility]  Office: [phone]

## 2017-07-08 NOTE — Lactation Note (Signed)
This note was copied from a baby's chart. Lactation Consultation Note  Patient Name: Bonnie Evans [id number] Date: 07/08/2017 Reason for consult: Follow-up assessment;Infant weight loss (Per Leandrew Koyanagi discharge paperwork completed and mom deferred see [place] prior to Disch - ready to leave)   Maternal Data    Feeding Feeding Type: Breast Fed  LATCH Score Latch: Grasps breast easily, tongue down, lips flanged, rhythmical sucking.  Audible Swallowing: A few with stimulation  Type of Nipple: Everted at rest and after stimulation  Comfort (Breast/Nipple): Soft / non-tender  Hold (Positioning): No assistance needed to correctly position infant at breast.  LATCH Score: 9  Interventions Interventions: Breast feeding basics reviewed  Lactation Tools Discussed/Used Tools: Pump Dorian Pod will instruct ) Breast pump type: Manual   Consult Status Consult Status: Complete Date: 07/08/17    Jerlyn Ly Bobbi Kozakiewicz 07/08/2017, 12:16 PM

## 2017-07-08 NOTE — Discharge Summary (Signed)
Obstetric Discharge Summary Reason for Admission: onset of labor Prenatal Procedures: NST and ultrasound Intrapartum Procedures: cesarean: low cervical, transverse Postpartum Procedures: none Complications-Operative and Postpartum: none Hemoglobin  Date Value Ref Range Status  07/06/2017 9.4 (L) 12.0 - 15.0 g/dL Final   HCT  Date Value Ref Range Status  07/06/2017 28.2 (L) 36.0 - 46.0 % Final    Physical Exam:  General: alert, cooperative and appears stated age 31: appropriate Uterine Fundus: firm Incision: healing well DVT Evaluation: No evidence of DVT seen on physical exam.  Discharge Diagnoses: Term Pregnancy-delivered, Active Genital Herpes outbreak  Discharge Information: Date: 07/08/2017 Activity: pelvic rest Diet: routine Medications: Ibuprofen, Colace and Percocet Condition: improved Instructions: refer to practice specific booklet Discharge to: home Follow-up Information    Jerelyn Charles, MD Follow up in 4 week(s).   Specialty:  Obstetrics Why:  For a postpartum evaluation Contact information: [address] [place] [address] [phone]           Newborn Data: Live born female  Birth Weight: 7 lb 13.9 oz (3570 g) APGAR: 40, 9  Newborn Delivery   Birth date/time:  07/06/2017 00:13:00 Delivery type:  C-Section, Low Transverse  C-section categorization:  Primary     Home with mother.  Bonnie Evans. 07/08/2017, 10:27 AM

## 2017-08-08 ENCOUNTER — Telehealth: Payer: Self-pay | Admitting: Family Medicine

## 2017-08-08 NOTE — Telephone Encounter (Signed)
Copied from [place] [phone]. Topic: Quick Communication - See Telephone Encounter >> Aug 08, 2017  4:34 PM Antonieta Iba C wrote: CRM for notification. See Telephone encounter for:  08/08/17.    Pt called in because she said that she need a Tie ter for the chicken pox and a TB test for school. Pt says that she works for [employer] so she can have completed at work. She would like to have a hard copy. ALSO, pt says that she just had a baby and it is hard for her to get out, she would like to discuss ways that she is able to obtain hard copy of orders.    Please call and advise.  - [phone]    Thanks.

## 2017-08-09 NOTE — Telephone Encounter (Signed)
Dr. Tamala Julian, please have orders for pt  faxed to [place]

## 2017-08-09 NOTE — Telephone Encounter (Signed)
Message sent to Dr. Tamala Julian to place orders for [facility] for chicken pox titer and TB test.  Spoke with Bonnie Evans.  She will choose site for [place] to get titer for Chicken pox and TB test done as an employee of [employer].  She will provide fax number to [place] and let us know if the [place] she chooses is compatible with Epic.  If compatible, they will automatically see the order placed by Dr. Tamala Julian

## 2017-08-09 NOTE — Telephone Encounter (Signed)
Patient calling back with fax number. Fax#: [phone]. They also need dx codes.

## 2017-08-13 ENCOUNTER — Telehealth: Payer: Self-pay | Admitting: Family Medicine

## 2017-08-13 NOTE — Telephone Encounter (Signed)
Copied from [place] [id number]. Topic: Inquiry >> Aug 13, 2017 11:42 AM Bonnie Evans I, NT wrote: Reason for CRM: Pt call and want to know what happen with her  paper work  the for the clinic the Doctor Tamala Julian want her to Go

## 2017-08-14 NOTE — Telephone Encounter (Signed)
Call --- patient needs appointment for labwork.  Please schedule office visit.  I see that patient had a baby five weeks ago; she should be able to drive and travel to appointment at this point. I have not seen her in a year; this can easily be handled in a visit.

## 2017-08-14 NOTE — Telephone Encounter (Signed)
Spoke with [place] she is looking into it

## 2017-09-24 ENCOUNTER — Encounter: Payer: Self-pay | Admitting: Family Medicine

## 2018-01-25 ENCOUNTER — Encounter: Payer: Self-pay | Admitting: Family Medicine

## 2018-01-30 ENCOUNTER — Encounter: Payer: Self-pay | Admitting: Family Medicine

## 2019-03-31 ENCOUNTER — Other Ambulatory Visit: Payer: Self-pay

## 2019-03-31 DIAGNOSIS — Z20828 Contact with and (suspected) exposure to other viral communicable diseases: Secondary | ICD-10-CM

## 2019-03-31 DIAGNOSIS — Z20822 Contact with and (suspected) exposure to covid-19: Secondary | ICD-10-CM

## 2019-03-31 NOTE — Progress Notes (Signed)
c 

## 2019-04-03 LAB — NOVEL CORONAVIRUS, NAA: SARS-CoV-2, NAA: NOT DETECTED

## 2019-04-29 ENCOUNTER — Other Ambulatory Visit: Payer: Self-pay

## 2019-04-29 DIAGNOSIS — Z20822 Contact with and (suspected) exposure to covid-19: Secondary | ICD-10-CM

## 2019-04-30 LAB — NOVEL CORONAVIRUS, NAA: SARS-CoV-2, NAA: NOT DETECTED

## 2019-06-09 ENCOUNTER — Other Ambulatory Visit: Payer: Self-pay

## 2019-06-09 DIAGNOSIS — Z20822 Contact with and (suspected) exposure to covid-19: Secondary | ICD-10-CM

## 2019-06-10 LAB — NOVEL CORONAVIRUS, NAA: SARS-CoV-2, NAA: NOT DETECTED

## 2019-08-05 ENCOUNTER — Other Ambulatory Visit: Payer: Self-pay

## 2019-08-05 DIAGNOSIS — Z20822 Contact with and (suspected) exposure to covid-19: Secondary | ICD-10-CM

## 2019-08-07 LAB — NOVEL CORONAVIRUS, NAA: SARS-CoV-2, NAA: NOT DETECTED

## 2019-09-15 ENCOUNTER — Ambulatory Visit: Payer: BC Managed Care – PPO | Attending: Internal Medicine

## 2019-09-15 DIAGNOSIS — Z20822 Contact with and (suspected) exposure to covid-19: Secondary | ICD-10-CM

## 2019-09-16 LAB — NOVEL CORONAVIRUS, NAA: SARS-CoV-2, NAA: NOT DETECTED

## 2019-10-06 ENCOUNTER — Ambulatory Visit: Payer: BC Managed Care – PPO | Attending: Internal Medicine

## 2019-10-06 DIAGNOSIS — Z20822 Contact with and (suspected) exposure to covid-19: Secondary | ICD-10-CM

## 2019-10-07 LAB — NOVEL CORONAVIRUS, NAA: SARS-CoV-2, NAA: NOT DETECTED

## 2020-08-12 LAB — OB RESULTS CONSOLE HEPATITIS B SURFACE ANTIGEN: Hepatitis B Surface Ag: NEGATIVE

## 2020-08-12 LAB — OB RESULTS CONSOLE GC/CHLAMYDIA
Chlamydia: NEGATIVE
Gonorrhea: NEGATIVE

## 2020-08-12 LAB — OB RESULTS CONSOLE ABO/RH: RH Type: POSITIVE

## 2020-08-12 LAB — OB RESULTS CONSOLE RUBELLA ANTIBODY, IGM: Rubella: IMMUNE

## 2020-08-12 LAB — OB RESULTS CONSOLE ANTIBODY SCREEN: Antibody Screen: NEGATIVE

## 2020-08-12 LAB — OB RESULTS CONSOLE RPR: RPR: NONREACTIVE

## 2020-08-12 LAB — OB RESULTS CONSOLE HIV ANTIBODY (ROUTINE TESTING): HIV: NONREACTIVE

## 2020-09-11 NOTE — L&D Delivery Note (Signed)
Delivery Note Labor onset:   03/03/21 Labor Onset Time: 1200 Complete dilation at 12:15 PM  Onset of pushing at 1215 FHR second stage Cat 2 Analgesia/Anesthesia intrapartum: Epidural  Guided pushing with maternal urge. Delivery of a viable female at 50. Fetal head delivered in OA with restitution to LOA position.  Nuchal cord: x 1 loose, reduced.  Infant placed on maternal abd, dried, and tactile stim.  Cord double clamped after 1 minute and cut by FOB.  NICU present for birth due to late decelerations and meconium  Cord blood sample collected: yes Arterial cord blood sample collected: no  Placenta delivered [place], intact, with 3 VC.  Placenta to L&D. Uterine tone firm, bleeding minimal AMTSL  1st degree laceration identified.  Anesthesia: epidural Repair: 3-0 vicryl  QBL/EBL (mL): 115 Complications: nuchal cord APGAR: APGAR (1 MIN): 5   APGAR (5 MINS): 8   APGAR (10 MINS): 9   Mom to postpartum.  Baby to Couplet care / Skin to Skin. Dr Charlesetta Garibaldi aware of delivery and POC.  [place] MSN, CNM 03/04/2021, 3:46 PM

## 2021-01-09 ENCOUNTER — Encounter (HOSPITAL_COMMUNITY): Payer: Self-pay | Admitting: Obstetrics and Gynecology

## 2021-01-09 ENCOUNTER — Inpatient Hospital Stay (HOSPITAL_COMMUNITY)
Admission: AD | Admit: 2021-01-09 | Discharge: 2021-01-09 | Disposition: A | Discharge status: Home / Self Care | Payer: BC Managed Care – PPO | Attending: Obstetrics and Gynecology | Admitting: Obstetrics and Gynecology

## 2021-01-09 ENCOUNTER — Other Ambulatory Visit: Payer: Self-pay

## 2021-01-09 DIAGNOSIS — O99613 Diseases of the digestive system complicating pregnancy, third trimester: Secondary | ICD-10-CM

## 2021-01-09 DIAGNOSIS — K529 Noninfective gastroenteritis and colitis, unspecified: Secondary | ICD-10-CM | POA: Diagnosis present

## 2021-01-09 DIAGNOSIS — Z3A32 32 weeks gestation of pregnancy: Secondary | ICD-10-CM | POA: Diagnosis not present

## 2021-01-09 LAB — URINALYSIS, ROUTINE W REFLEX MICROSCOPIC
Bilirubin Urine: NEGATIVE
Glucose, UA: NEGATIVE mg/dL
Hgb urine dipstick: NEGATIVE
Ketones, ur: 5 mg/dL — AB
Leukocytes,Ua: NEGATIVE
Nitrite: NEGATIVE
Protein, ur: 100 mg/dL — AB
Specific Gravity, Urine: 1.026 (ref 1.005–1.030)
pH: 6 (ref 5.0–8.0)

## 2021-01-09 MED ORDER — ONDANSETRON 4 MG PO TBDP: 4.0000 mg | ORAL_TABLET | Freq: Three times a day (TID) | ORAL | 0 refills | Status: DC | PRN

## 2021-01-09 MED ORDER — ONDANSETRON 4 MG PO TBDP
8.0000 mg | ORAL_TABLET | Freq: Once | ORAL | Status: AC
  Administered 2021-01-09: 8 mg via ORAL
  Filled 2021-01-09: qty 2

## 2021-01-09 NOTE — MAU Provider Note (Signed)
History     CSN: [id number]  Arrival date and time: 01/09/21 1934   Event Date/Time   First Provider Initiated Contact with Patient 01/09/21 2022      Chief Complaint  Patient presents with  . Abdominal Cramping  . Diarrhea  . Emesis During Pregnancy   HPI Bonnie Evans is a 35 y.o. G2P1001 at [redacted]w[redacted]d who presents with n/v/d and abdominal cramping. Symptoms started about 4 hours ago after eating pasta salad & shrimp. Reports 5 watery stools. Has vomited once but has constant nausea. Her husband ate the same food but has not had symptoms. Denies sick contacts. She works as a Government social research officer & does not know if she's been exposed to illness. Reports some lower abdominal cramping with her symptoms. Rates pain 5/10. Pain is intermittent. Hasn't treated symptoms. Denies LOF or vaginal bleeding. Reports good fetal movement.    OB History    Gravida  2   Para  1   Term  1   Preterm      AB      Living  1     SAB      IAB      Ectopic      Multiple  0   Live Births  1           Past Medical History:  Diagnosis Date  . Anemia   . Genital herpes     Past Surgical History:  Procedure Laterality Date  . CESAREAN SECTION N/A 07/05/2017   Procedure: CESAREAN SECTION;  Surgeon: Jerelyn Charles, MD;  Location: [facility];  Service: Obstetrics;  Laterality: N/A;    Family History  Problem Relation Age of Onset  . Hypertension Mother   . Goiter Mother   . Asthma Brother     Social History   Tobacco Use  . Smoking status: Never Smoker  . Smokeless tobacco: Never Used  Vaping Use  . Vaping Use: Never used  Substance Use Topics  . Alcohol use: No  . Drug use: No    Allergies: No Known Allergies  Medications Prior to Admission  Medication Sig Dispense Refill Last Dose  . ferrous sulfate 325 (65 FE) MG tablet Take 325 mg by mouth daily with breakfast.   Past Week at Unknown time  . Iron-FA-B Cmp-C-Biot-Probiotic (FUSION PLUS) CAPS Take 1 capsule by mouth  daily.     . valACYclovir (VALTREX) 500 MG tablet valacyclovir 500 mg tablet  Take 1 tablet twice a day by oral route for 30 days.   Unknown at Unknown time    Review of Systems  Constitutional: Negative.   Gastrointestinal: Positive for abdominal pain, diarrhea, nausea and vomiting. Negative for blood in stool and constipation.  Genitourinary: Negative.    Physical Exam   Blood pressure 129/72, pulse 95, temperature 98.2 F (36.8 C), temperature source Oral, resp. rate 18, height 5\' 4"  (1.626 m), weight 109.7 kg, SpO2 98 %, unknown if currently breastfeeding.  Physical Exam Vitals and nursing note reviewed. Exam conducted with a chaperone present.  Constitutional:      General: She is not in acute distress.    Appearance: Normal appearance.  HENT:     Head: Normocephalic and atraumatic.  Pulmonary:     Effort: Pulmonary effort is normal. No respiratory distress.  Abdominal:     Tenderness: There is no abdominal tenderness.  Genitourinary:    Comments: Dilation: Closed Effacement (%): Thick Cervical Position: Middle Station: -3 Exam by:: Jorje Guild NP  Skin:    General: Skin is warm and dry.  Neurological:     Mental Status: She is alert.  Psychiatric:        Mood and Affect: Mood normal.        Behavior: Behavior normal.    NST:  Baseline: 150 bpm, Variability: Good {> 6 bpm), Accelerations: Reactive and Decelerations: Absent  MAU Course  Procedures No results found for this or any previous visit (from the past 24 hour(s)).  MDM Patient presents with n/v/d & cramping. No contractions on toco, abdomen soft, & cervix closed/thic. Suspect cramping GI in nature. Discussed treatment options, oral vs IV while in MAU. Patient's preference is zofran ODT. Would like to be discharged home with zofran Rx.   Assessment and Plan   1. Gastroenteritis, acute   2. [redacted] weeks gestation of pregnancy   -Rx zofran -stay hydrated -bland diet -reviewed reasons to return to  [place] 01/09/2021, 8:22 PM

## 2021-01-09 NOTE — Discharge Instructions (Signed)
Bland Diet A bland diet consists of foods that are often soft and do not have a lot of fat, fiber, or extra seasonings. Foods without fat, fiber, or seasoning are easier for the body to digest. They are also less likely to irritate your mouth, throat, stomach, and other parts of your digestive system. A bland diet is sometimes called a BRAT diet. What is my plan? Your health care provider or food and nutrition specialist (dietitian) may recommend specific changes to your diet to prevent symptoms or to treat your symptoms. These changes may include:  Eating small meals often.  Cooking food until it is soft enough to chew easily.  Chewing your food well.  Drinking fluids slowly.  Not eating foods that are very spicy, sour, or fatty.  Not eating citrus fruits, such as oranges and grapefruit. What do I need to know about this diet?  Eat a variety of foods from the bland diet food list.  Do not follow a bland diet longer than needed.  Ask your health care provider whether you should take vitamins or supplements. What foods can I eat? Grains Hot cereals, such as cream of wheat. Rice. Bread, crackers, or tortillas made from refined white flour.   Vegetables Canned or cooked vegetables. Mashed or boiled potatoes. Fruits Bananas. Applesauce. Other types of cooked or canned fruit with the skin and seeds removed, such as canned peaches or pears.   Meats and other proteins Scrambled eggs. Creamy peanut butter or other nut butters. Lean, well-cooked meats, such as chicken or fish. Tofu. Soups or broths.   Dairy Low-fat dairy products, such as milk, cottage cheese, or yogurt. Beverages Water. Herbal tea. Apple juice.   Fats and oils Mild salad dressings. Canola or olive oil. Sweets and desserts Pudding. Custard. Fruit gelatin. Ice cream. The items listed above may not be a complete list of recommended foods and beverages. Contact a dietitian for more options. What foods are not  recommended? Grains Whole grain breads and cereals. Vegetables Raw vegetables. Fruits Raw fruits, especially citrus, berries, or dried fruits. Dairy Whole fat dairy foods. Beverages Caffeinated drinks. Alcohol. Seasonings and condiments Strongly flavored seasonings or condiments. Hot sauce. Salsa. Other foods Spicy foods. Fried foods. Sour foods, such as pickled or fermented foods. Foods with high sugar content. Foods high in fiber. The items listed above may not be a complete list of foods and beverages to avoid. Contact a dietitian for more information. Summary  A bland diet consists of foods that are often soft and do not have a lot of fat, fiber, or extra seasonings.  Foods without fat, fiber, or seasoning are easier for the body to digest.  Check with your health care provider to see how long you should follow this diet plan. It is not meant to be followed for long periods. This information is not intended to replace advice given to you by your health care provider. Make sure you discuss any questions you have with your health care provider. Document Revised: 09/26/2017 Document Reviewed: 09/26/2017 Elsevier Patient Education  2021 Elsevier Inc.  

## 2021-01-09 NOTE — MAU Note (Signed)
..  Bonnie Evans is a 35 y.o. at [redacted]w[redacted]d here in MAU reporting: Diarrhea, vomiting, and abdominal cramping that began today. Diarrhea episodes 3 in the last 24 hours. She vomited once as she was getting into the car to come to the hospital.  +FM. Denies contractions. Vaginal itching that began 2-3 days ago.  Pain score: 5/10 Vitals:   01/09/21 1947  BP: 129/72  Pulse: 95  Resp: 18  Temp: 98.2 F (36.8 C)  SpO2: 98%     FHT: 160 Lab orders placed from triage: UA

## 2021-02-03 ENCOUNTER — Other Ambulatory Visit: Payer: Self-pay | Admitting: Obstetrics & Gynecology

## 2021-02-03 LAB — OB RESULTS CONSOLE GBS: GBS: NEGATIVE

## 2021-02-16 ENCOUNTER — Other Ambulatory Visit: Payer: Self-pay | Admitting: Obstetrics & Gynecology

## 2021-02-21 ENCOUNTER — Telehealth (HOSPITAL_COMMUNITY): Payer: Self-pay | Admitting: *Deleted

## 2021-02-21 NOTE — Telephone Encounter (Signed)
Preadmission screen  

## 2021-02-22 ENCOUNTER — Encounter (HOSPITAL_COMMUNITY): Payer: Self-pay

## 2021-02-23 ENCOUNTER — Telehealth (HOSPITAL_COMMUNITY): Payer: Self-pay | Admitting: *Deleted

## 2021-02-23 NOTE — Telephone Encounter (Signed)
Preadmission screen  

## 2021-02-25 ENCOUNTER — Inpatient Hospital Stay (HOSPITAL_COMMUNITY): Admit: 2021-02-25 | Payer: BC Managed Care – PPO | Admitting: Obstetrics & Gynecology

## 2021-02-25 ENCOUNTER — Telehealth (HOSPITAL_COMMUNITY): Payer: Self-pay | Admitting: *Deleted

## 2021-02-25 ENCOUNTER — Encounter (HOSPITAL_COMMUNITY): Payer: Self-pay

## 2021-02-25 ENCOUNTER — Encounter (HOSPITAL_COMMUNITY): Payer: Self-pay | Admitting: *Deleted

## 2021-02-25 SURGERY — Surgical Case
Anesthesia: Regional

## 2021-02-25 NOTE — Telephone Encounter (Signed)
Preadmission screen  

## 2021-02-28 ENCOUNTER — Other Ambulatory Visit (HOSPITAL_COMMUNITY): Payer: BC Managed Care – PPO | Attending: Obstetrics & Gynecology

## 2021-03-01 NOTE — H&P (Addendum)
Bonnie Evans is a 35 y.o. female, G2P1001, IUP at 40 weeks, presenting for [facility] for BMI>40. H/O Obesity BMI 41, HSV on vlatreex, no prodromal s/sx or lesion, anemia at NOB 10.6 hgb on po iron, AMA unity WNL, Previous CS in 2018 due to HSV lesion LTCS with 2 layer closure. Ultrasound Images 02-17-2021 BPP--VTX, ANTERIOR PLACENTA, AFI 22.7, 8+2, 84%ILE, BPP 8/8. Pt endorse + Fm. Denies vaginal leakage. Denies vaginal bleeding. Denies feeling cxt's.   Patient Active Problem List   Diagnosis Date Noted   Morbid obesity with body mass index (BMI) of 40.0 or higher ([place]) 03/02/2021   Genital herpes simplex 05/30/2017   Female infertility 04/08/2016   ANEMIA, IRON DEFICIENCY 10/10/2007     Active Ambulatory Problems    Diagnosis Date Noted   ANEMIA, IRON DEFICIENCY 10/10/2007   Genital herpes simplex 05/30/2017   Female infertility 04/08/2016   Resolved Ambulatory Problems    Diagnosis Date Noted   Full-term premature rupture of membranes with onset of labor within 24 hours of rupture 07/06/2017   Past Medical History:  Diagnosis Date   Anemia    Genital herpes       Medications Prior to Admission  Medication Sig Dispense Refill Last Dose   ferrous sulfate 325 (65 FE) MG tablet Take 325 mg by mouth daily with breakfast.      Iron-FA-B Cmp-C-Biot-Probiotic (FUSION PLUS) CAPS Take 1 capsule by mouth daily.      ondansetron (ZOFRAN ODT) 4 MG disintegrating tablet Take 1 tablet (4 mg total) by mouth every 8 (eight) hours as needed for nausea or vomiting. 15 tablet 0    valACYclovir (VALTREX) 500 MG tablet valacyclovir 500 mg tablet  Take 1 tablet twice a day by oral route for 30 days.       Past Medical History:  Diagnosis Date   Anemia    Genital herpes      No current facility-administered medications on file prior to encounter.   Current Outpatient Medications on File Prior to Encounter  Medication Sig Dispense Refill   ferrous sulfate 325 (65 FE) MG tablet Take 325 mg  by mouth daily with breakfast.     Iron-FA-B Cmp-C-Biot-Probiotic (FUSION PLUS) CAPS Take 1 capsule by mouth daily.     ondansetron (ZOFRAN ODT) 4 MG disintegrating tablet Take 1 tablet (4 mg total) by mouth every 8 (eight) hours as needed for nausea or vomiting. 15 tablet 0   valACYclovir (VALTREX) 500 MG tablet valacyclovir 500 mg tablet  Take 1 tablet twice a day by oral route for 30 days.       No Known Allergies  History of present pregnancy: Pt Info/Preference:  Screening/Consents:  Labs:   EDD: Estimated Date of Delivery: 03/02/21  Establised: No LMP recorded. Patient is pregnant.  Anatomy Scan: Date: 11/05/2020 Placenta Location: anterior and complete Genetic Screen: Panoroma:unity WNL AFP:  First Tri: Quad:  Office: [facility]            Md: DR Alwyn Pea First PNV: 15.1 weeks Blood Type --/--/B POS (06/22 0015)  Language: english Last PNV: 39.2 weeks Rhogam    Flu Vaccine:  declined   Antibody NEG (06/22 0015)  TDaP vaccine utd   GTT: Early: 4.7 Third Trimester: 127  Feeding Plan: Breast/bottle BTL: no Rubella: Immune (12/02 0000)  Contraception: ??? VBAC: TOLAC desired consent signed 12/27/2020 RPR: Nonreactive (12/02 0000)   Circumcision: ???   HBsAg: Negative (12/02 0000)  Pediatrician:  ???   HIV: Non-reactive (12/02  0000)   Prenatal Classes: no Additional [place]: See HPI GBS: Negative/-- (05/26 0000)Negative (For PCN allergy, check sensitivities)       Chlamydia: neg    MFM Referral/Consult:  GC: neg  Support Person: partner   PAP: ???  Pain Management: epidural Neonatologist Referral:  Hgb Electrophoresis:  AA  Birth Plan: [place], TOLAC   Hgb NOB: 12    28W: 10.6   OB History     Gravida  2   Para  1   Term  1   Preterm      AB      Living  1      SAB      IAB      Ectopic      Multiple  0   Live Births  1          Past Medical History:  Diagnosis Date   Anemia    Genital herpes    Past Surgical History:  Procedure Laterality Date   CESAREAN SECTION N/A  07/05/2017   Procedure: CESAREAN SECTION;  Surgeon: Jerelyn Charles, MD;  Location: [facility];  Service: Obstetrics;  Laterality: N/A;   Family History: family history includes Asthma in her brother; Goiter in her mother; Heart murmur in her mother; Hypertension in her mother. Social History:  reports that she has never smoked. She has never used smokeless tobacco. She reports that she does not drink alcohol and does not use drugs.   Prenatal Transfer Tool  Maternal Diabetes: No Genetic Screening: Normal Maternal Ultrasounds/Referrals: Normal Fetal Ultrasounds or other Referrals:  None Maternal Substance Abuse:  No Significant Maternal Medications:  Meds include: Other:  Valtrex Significant Maternal Lab Results: Group B Strep negative and Other:  HSV+, no lesions   ROS:  Review of Systems  Constitutional: Negative.   HENT: Negative.    Eyes: Negative.   Respiratory: Negative.    Cardiovascular: Negative.   Gastrointestinal: Negative.   Genitourinary: Negative.   Musculoskeletal: Negative.   Skin: Negative.   Neurological: Negative.   Endo/Heme/Allergies: Negative.   Psychiatric/Behavioral: Negative.      Physical Exam: BP 123/69   Pulse 73   Ht 5\' 4"  (1.626 m)   Wt 116.5 kg   BMI 44.08 kg/m   Physical Exam Vitals and nursing note reviewed.  Constitutional:      Appearance: Normal appearance. She is obese.  HENT:     Head: Normocephalic.     Nose: Nose normal.     Mouth/Throat:     Mouth: Mucous membranes are moist.  Eyes:     Conjunctiva/sclera: Conjunctivae normal.     Pupils: Pupils are equal, round, and reactive to light.  Cardiovascular:     Rate and Rhythm: Normal rate and regular rhythm.     Pulses: Normal pulses.  Pulmonary:     Effort: Pulmonary effort is normal.  Abdominal:     General: Bowel sounds are normal.  Genitourinary:    General: Normal vulva.     Rectum: Normal.     Comments: Speculum exam shows no lesions, uterus gravida equal to  dates, pelvis adequate for vaginal delivery.  Musculoskeletal:        General: Normal range of motion.     Cervical back: Normal range of motion and neck supple.  Skin:    General: Skin is warm.     Capillary Refill: Capillary refill takes less than 2 seconds.  Neurological:     General: No focal deficit present.  Mental Status: She is alert.  Psychiatric:        Mood and Affect: Mood normal.     NST: FHR baseline 135 bpm, Variability: moderate, Accelerations:present, Decelerations:  Absent= Cat 1/Reactive UC:   OCC SVE:   Dilation: Closed (external os 0.5 internal os closed) Effacement (%): Thick Station: Ballotable Exam by:: [facility] CNM, vertex verified by fetal sutures.  Leopold's: Position vertex, EFW 8.5lbs via leopold's.  Bedside US verified vertex.   Labs: Results for orders placed or performed during the hospital encounter of 03/02/21 (from the past 24 hour(s))  CBC     Status: Abnormal   Collection Time: 03/02/21 12:15 AM  Result Value Ref Range   WBC 7.8 4.0 - 10.5 K/uL   RBC 3.30 (L) 3.87 - 5.11 MIL/uL   Hemoglobin 9.1 (L) 12.0 - 15.0 g/dL   HCT 28.8 (L) 36.0 - 46.0 %   MCV 87.3 80.0 - 100.0 fL   MCH 27.6 26.0 - 34.0 pg   MCHC 31.6 30.0 - 36.0 g/dL   RDW 14.4 11.5 - 15.5 %   Platelets 466 (H) 150 - 400 K/uL   nRBC 0.0 0.0 - 0.2 %  Type and screen     Status: None   Collection Time: 03/02/21 12:15 AM  Result Value Ref Range   ABO/RH(D) B POS    Antibody Screen NEG    Sample Expiration      03/05/2021,2359 Performed at [facility], [place] [address], [place], [place] [address]   Resp Panel by RT-PCR (Flu A&B, Covid) Nasopharyngeal Swab     Status: None   Collection Time: 03/02/21  1:00 AM   Specimen: Nasopharyngeal Swab; Nasopharyngeal(NP) swabs in vial transport medium  Result Value Ref Range   SARS Coronavirus 2 by RT PCR NEGATIVE NEGATIVE   Influenza A by PCR NEGATIVE NEGATIVE   Influenza B by PCR NEGATIVE NEGATIVE    Imaging:  No  results found.  MAU Course: Orders Placed This Encounter  Procedures   Resp Panel by RT-PCR (Flu A&B, Covid) Nasopharyngeal Swab   OB RESULT CONSOLE Group B Strep   CBC   RPR   Diet clear liquid Room service appropriate? Yes; Fluid consistency: Thin   Evaluate fetal heart rate to establish reassuring pattern prior to initiating Cytotec or Pitocin   Perform a cervical exam prior to initiating Cytotec or Pitocin   Discontinue Pitocin if tachysystole with non-reassuring FHR is present   Nofify MD/CNM if tachysystole with non-reassuring FHR is present   Initiate intrauterine resuscitation if tachysystole with non-reasuring FHR is present   If tachysystole WITH reassuring FHR present notify MD / CNM   May administer Terbutaline 0.25 mg SQ x 1 dose if tachysystole with non-reassuring FHR is presesnt   Labor Induction   Vitals signs per unit policy   Notify Physician   Fetal monitoring per unit policy   Activity as tolerated   Cervical Exam   Measure blood pressure post delivery every 15 min x 1 hour then every 30 min x 1 hour   Fundal check post delivery every 15 min x 1 hour then every 30 min x 1 hour   If Rapid HIV test positive or known HIV positive: initiate AZT orders   May in and out cath x 2 for inability to void   Discontinue foley prior to vaginal delivery   Initiate Carrier Fluid Protocol   Initiate Oral Care Protocol   Order Rapid HIV per protocol if no  results on chart   SCDs   Patient may have epidural placement upon request   Full code   Airborne and Contact precautions   Type and screen   Insert and maintain IV Line   Admit to Inpatient (patient's expected length of stay will be greater than 2 midnights or inpatient only procedure)   Meds ordered this encounter  Medications   terbutaline (BRETHINE) injection 0.25 mg   oxytocin (PITOCIN) IV infusion 30 units in NS 500 mL - Premix    Order Specific Question:   Begin infusion at:    Answer:   2 milli-units/min (2  mL/hr)    Order Specific Question:   Increase infusion by:    Answer:   2 milli-units/min (2 mL/hr)   lactated ringers infusion   oxytocin (PITOCIN) IV BOLUS FROM BAG   oxytocin (PITOCIN) IV infusion 30 units in NS 500 mL - Premix   lactated ringers infusion 500-1,000 mL   acetaminophen (TYLENOL) tablet 650 mg   ondansetron (ZOFRAN) injection 4 mg   sodium citrate-citric acid (ORACIT) solution 30 mL   lidocaine (PF) (XYLOCAINE) 1 % injection 30 mL   fentaNYL (SUBLIMAZE) injection 50-100 mcg    Assessment/Plan: MELANIA KIRKS is a 35 y.o. female, G2P1001,  IUP at 40 weeks, presenting for TOLAC IOL for BMI>40.  H/O Obesity BMI 41, HSV on vlatreex, no prodromal s/sx or lesion, anemia at NOB 10.6 hgb on po iron, AMA unity WNL, Previous CS in 2018 due to HSV lesion LTCS with 2 layer closure. Ultrasound Images 02-17-2021 BPP--VTX, ANTERIOR PLACENTA, AFI 22.7, 8+2, 84%ILE, BPP 8/8. Pt endorse + Fm. Denies vaginal leakage. Denies vaginal bleeding. Denies feeling cxt's.    FWB: Cat 1 Fetal Tracing.   Plan: Admit to [facility] per consult with DR Alwyn Pea Routine [facility] orders Pain med/epidural prn DO NOT GIVE CYTOTEC Begin IOL with pitocin low dose 2x2 to max of 10 until cervix ripe.  Anticipate foley bulb placement when cervix open.  Anticipate labor progression   [name] NP-C, CNM, MSN 03/02/2021, 5:59 AM

## 2021-03-02 ENCOUNTER — Encounter (HOSPITAL_COMMUNITY): Payer: Self-pay | Admitting: Obstetrics & Gynecology

## 2021-03-02 ENCOUNTER — Other Ambulatory Visit: Payer: Self-pay

## 2021-03-02 ENCOUNTER — Inpatient Hospital Stay (HOSPITAL_COMMUNITY)
Admission: AD | Admit: 2021-03-02 | Discharge: 2021-03-06 | DRG: 806 | Disposition: A | Discharge status: Home / Self Care | Payer: BC Managed Care – PPO | Attending: Obstetrics and Gynecology | Admitting: Obstetrics and Gynecology

## 2021-03-02 ENCOUNTER — Inpatient Hospital Stay (HOSPITAL_COMMUNITY): Payer: BC Managed Care – PPO

## 2021-03-02 DIAGNOSIS — A6000 Herpesviral infection of urogenital system, unspecified: Secondary | ICD-10-CM | POA: Diagnosis present

## 2021-03-02 DIAGNOSIS — D509 Iron deficiency anemia, unspecified: Secondary | ICD-10-CM | POA: Diagnosis present

## 2021-03-02 DIAGNOSIS — O99214 Obesity complicating childbirth: Secondary | ICD-10-CM | POA: Diagnosis present

## 2021-03-02 DIAGNOSIS — Z3A40 40 weeks gestation of pregnancy: Secondary | ICD-10-CM | POA: Diagnosis not present

## 2021-03-02 DIAGNOSIS — O9832 Other infections with a predominantly sexual mode of transmission complicating childbirth: Secondary | ICD-10-CM | POA: Diagnosis present

## 2021-03-02 DIAGNOSIS — O9902 Anemia complicating childbirth: Secondary | ICD-10-CM | POA: Diagnosis present

## 2021-03-02 DIAGNOSIS — O34211 Maternal care for low transverse scar from previous cesarean delivery: Secondary | ICD-10-CM | POA: Diagnosis present

## 2021-03-02 DIAGNOSIS — Z3490 Encounter for supervision of normal pregnancy, unspecified, unspecified trimester: Secondary | ICD-10-CM | POA: Diagnosis not present

## 2021-03-02 DIAGNOSIS — Z20822 Contact with and (suspected) exposure to covid-19: Secondary | ICD-10-CM | POA: Diagnosis present

## 2021-03-02 LAB — CBC
HCT: 28.8 % — ABNORMAL LOW (ref 36.0–46.0)
Hemoglobin: 9.1 g/dL — ABNORMAL LOW (ref 12.0–15.0)
MCH: 27.6 pg (ref 26.0–34.0)
MCHC: 31.6 g/dL (ref 30.0–36.0)
MCV: 87.3 fL (ref 80.0–100.0)
Platelets: 466 10*3/uL — ABNORMAL HIGH (ref 150–400)
RBC: 3.3 MIL/uL — ABNORMAL LOW (ref 3.87–5.11)
RDW: 14.4 % (ref 11.5–15.5)
WBC: 7.8 10*3/uL (ref 4.0–10.5)
nRBC: 0 % (ref 0.0–0.2)

## 2021-03-02 LAB — RESP PANEL BY RT-PCR (FLU A&B, COVID) ARPGX2
Influenza A by PCR: NEGATIVE
Influenza B by PCR: NEGATIVE
SARS Coronavirus 2 by RT PCR: NEGATIVE

## 2021-03-02 LAB — RPR: RPR Ser Ql: NONREACTIVE

## 2021-03-02 LAB — TYPE AND SCREEN
ABO/RH(D): B POS
Antibody Screen: NEGATIVE

## 2021-03-02 MED ORDER — LIDOCAINE HCL (PF) 1 % IJ SOLN: 30.0000 mL | INTRAMUSCULAR | Status: DC | PRN

## 2021-03-02 MED ORDER — FENTANYL-BUPIVACAINE-NACL 0.5-0.125-0.9 MG/250ML-% EP SOLN
12.0000 mL/h | EPIDURAL | Status: DC | PRN
Start: 2021-03-02 — End: 2021-03-04
  Administered 2021-03-03 – 2021-03-04 (×2): 12 mL/h via EPIDURAL
  Filled 2021-03-02 (×2): qty 250

## 2021-03-02 MED ORDER — ONDANSETRON HCL 4 MG/2ML IJ SOLN
4.0000 mg | Freq: Four times a day (QID) | INTRAMUSCULAR | Status: DC | PRN
  Administered 2021-03-03: 4 mg via INTRAVENOUS
  Filled 2021-03-02: qty 2

## 2021-03-02 MED ORDER — LACTATED RINGERS IV SOLN
500.0000 mL | Freq: Once | INTRAVENOUS | Status: AC
  Administered 2021-03-03: 500 mL via INTRAVENOUS

## 2021-03-02 MED ORDER — EPHEDRINE 5 MG/ML INJ: 10.0000 mg | INTRAVENOUS | Status: DC | PRN

## 2021-03-02 MED ORDER — FENTANYL CITRATE (PF) 100 MCG/2ML IJ SOLN
50.0000 ug | INTRAMUSCULAR | Status: DC | PRN
Start: 2021-03-02 — End: 2021-03-04
  Administered 2021-03-02 – 2021-03-03 (×4): 100 ug via INTRAVENOUS
  Filled 2021-03-02 (×4): qty 2

## 2021-03-02 MED ORDER — OXYTOCIN-SODIUM CHLORIDE 30-0.9 UT/500ML-% IV SOLN
2.5000 [IU]/h | INTRAVENOUS | Status: DC
  Administered 2021-03-04 (×2): 2.5 [IU]/h via INTRAVENOUS
  Filled 2021-03-02: qty 500

## 2021-03-02 MED ORDER — ACETAMINOPHEN 325 MG PO TABS: 650.0000 mg | ORAL_TABLET | ORAL | Status: DC | PRN

## 2021-03-02 MED ORDER — SOD CITRATE-CITRIC ACID 500-334 MG/5ML PO SOLN: 30.0000 mL | ORAL | Status: DC | PRN

## 2021-03-02 MED ORDER — LACTATED RINGERS IV SOLN: INTRAVENOUS | Status: DC

## 2021-03-02 MED ORDER — DIPHENHYDRAMINE HCL 50 MG/ML IJ SOLN
12.5000 mg | INTRAMUSCULAR | Status: DC | PRN
  Administered 2021-03-03: 12.5 mg via INTRAVENOUS
  Filled 2021-03-02: qty 1

## 2021-03-02 MED ORDER — LACTATED RINGERS IV SOLN
500.0000 mL | INTRAVENOUS | Status: DC | PRN
  Administered 2021-03-02 – 2021-03-04 (×2): 500 mL via INTRAVENOUS

## 2021-03-02 MED ORDER — PHENYLEPHRINE 40 MCG/ML (10ML) SYRINGE FOR IV PUSH (FOR BLOOD PRESSURE SUPPORT): 80.0000 ug | PREFILLED_SYRINGE | INTRAVENOUS | Status: DC | PRN

## 2021-03-02 MED ORDER — BUTORPHANOL TARTRATE 1 MG/ML IJ SOLN
1.0000 mg | Freq: Once | INTRAMUSCULAR | Status: AC
  Administered 2021-03-02: 1 mg via INTRAVENOUS
  Filled 2021-03-02: qty 1

## 2021-03-02 MED ORDER — BUTORPHANOL TARTRATE 1 MG/ML IJ SOLN: 1.0000 mg | INTRAMUSCULAR | Status: DC | PRN

## 2021-03-02 MED ORDER — OXYTOCIN-SODIUM CHLORIDE 30-0.9 UT/500ML-% IV SOLN
1.0000 m[IU]/min | INTRAVENOUS | Status: DC
Start: 2021-03-02 — End: 2021-03-03
  Administered 2021-03-02: 2 m[IU]/min via INTRAVENOUS
  Filled 2021-03-02: qty 500

## 2021-03-02 MED ORDER — TERBUTALINE SULFATE 1 MG/ML IJ SOLN: 0.2500 mg | Freq: Once | INTRAMUSCULAR | Status: DC | PRN

## 2021-03-02 MED ORDER — OXYTOCIN BOLUS FROM INFUSION
333.0000 mL | Freq: Once | INTRAVENOUS | Status: AC
  Administered 2021-03-04: 333 mL via INTRAVENOUS

## 2021-03-02 NOTE — Progress Notes (Signed)
Labor Progress Note  Bonnie Evans is a 35 y.o. female, G2P1001,  IUP at 40 weeks, presenting for [place] for BMI>40.  H/O Obesity BMI 41, HSV on vlatreex, no prodromal s/sx or lesion, anemia at NOB 10.6 hgb on po iron, AMA unity WNL, Previous CS in 2018 due to HSV lesion LTCS with 2 layer closure. Ultrasound Images 02-17-2021 BPP--VTX, ANTERIOR PLACENTA, AFI 22.7, 8+2, 84%ILE, BPP 8/8.  Subjective: Pt stable resting after IV pain meds, pt feeling cxt and unable to sleep, pt endorses cxt are tolerable, but desires a nap.  Patient Active Problem List   Diagnosis Date Noted   Morbid obesity with body mass index (BMI) of 40.0 or higher ([place]) 03/02/2021   Genital herpes simplex 05/30/2017   Female infertility 04/08/2016   ANEMIA, IRON DEFICIENCY 10/10/2007   Objective: BP 125/80   Pulse 63   Temp 97.9 F (36.6 C) (Axillary)   Ht 5\' 4"  (1.626 m)   Wt 116.5 kg   BMI 44.08 kg/m  No intake/output data recorded. No intake/output data recorded. NST: FHR baseline 140 bpm, Variability: moderate, Accelerations:present, Decelerations:  Absent= Cat 1/Reactive CTX:  irregular, every 4-7 minutes, 40-70 seconds Uterus gravid, soft non tender, mild to palpate with contractions.  SVE:  Dilation: Closed Effacement (%): 50 Station: [employer] Exam by:: [employer], CNM Pitocin at 10 mUn/min  Assessment:  Bonnie Evans is a 35 y.o. female, G2P1001,  IUP at 40 weeks, presenting for [facility] for BMI>40.  H/O Obesity BMI 41, HSV on vlatreex, no prodromal s/sx or lesion, anemia at NOB 10.6 hgb on po iron, AMA unity WNL, Previous CS in 2018 due to HSV lesion LTCS with 2 layer closure. Ultrasound Images 02-17-2021 BPP--VTX, ANTERIOR PLACENTA, AFI 22.7, 8+2, 84%ILE, BPP 8/8. On pitocin 10 for cervical ripening, unable to place foley bulb cervix still closed.  Patient Active Problem List   Diagnosis Date Noted   Morbid obesity with body mass index (BMI) of 40.0 or higher ([place]) 03/02/2021   Genital  herpes simplex 05/30/2017   Female infertility 04/08/2016   ANEMIA, IRON DEFICIENCY 10/10/2007   NICHD: Category 1  Membranes: Intact, no s/s of infection  Induction:    Cytotec xN/A pt TOLAC, contraindicated  Foley Bulb: unable to place, cervix closed  Pitocin - 10  Pain management:               IV pain management: x fentanyl at 0926  Nitrous: PRN             Epidural placement: PRN  GBS Negative   Plan: Continue labor plan Continuous monitoring Rest Ambulate Frequent position changes to facilitate fetal rotation and descent. Will reassess with cervical exam at 4 hours or earlier if necessary Continue pitocin per protocol to max of 10 until cervix ripe Anticipate foley bulb once cervix open Anticipate AROM once fetal head in pelvis.  Anticipate labor progression and vaginal delivery.   [name], NP-C, CNM, MSN 03/02/2021. 9:47 AM

## 2021-03-02 NOTE — Progress Notes (Addendum)
Labor Progress Note  Bonnie Evans is a 35 y.o. female, G2P1001,  IUP at 40 weeks, presenting for [place] for BMI>40.  H/O Obesity BMI 41, HSV on vlatreex, no prodromal s/sx or lesion, anemia at NOB 10.6 hgb on po iron, AMA unity WNL, Previous CS in 2018 due to HSV lesion LTCS with 2 layer closure. Ultrasound Images 02-17-2021 BPP--VTX, ANTERIOR PLACENTA, AFI 22.7, 8+2, 84%ILE, BPP 8/8.  Subjective: Pt stable in room with support at bedside, discussed R/B/A of Pitocin, Foley, AROM, pt consented to pitocin and desires to continue, feeling cxt mild, and tolerates well, pt endorses she is able to sleep through cxt still. Cervix still closed on 8 of pit.  Patient Active Problem List   Diagnosis Date Noted   Morbid obesity with body mass index (BMI) of 40.0 or higher ([place]) 03/02/2021   Genital herpes simplex 05/30/2017   Female infertility 04/08/2016   ANEMIA, IRON DEFICIENCY 10/10/2007   Objective: BP 123/69   Pulse 73   Ht 5\' 4"  (1.626 m)   Wt 116.5 kg   BMI 44.08 kg/m  No intake/output data recorded. No intake/output data recorded. NST: FHR baseline 120 bpm, Variability: moderate, Accelerations:present, Decelerations:  Absent= Cat 1/Reactive CTX:  irregular, every 3-6 minutes Uterus gravid, soft non tender, moderate to palpate with contractions.  SVE:  Dilation: Closed (external os 0.5 internal os closed) Effacement (%): Thick Station: Ballotable Exam by:: [employer] CNM Pitocin at 22mUn/min  Assessment:  Bonnie Evans is a 35 y.o. female, G2P1001,  IUP at 40 weeks, presenting for [place] for BMI>40.  H/O Obesity BMI 41, HSV on vlatreex, no prodromal s/sx or lesion, anemia at NOB 10.6 hgb on po iron, AMA unity WNL, Previous CS in 2018 due to HSV lesion LTCS with 2 layer closure. Ultrasound Images 02-17-2021 BPP--VTX, ANTERIOR PLACENTA, AFI 22.7, 8+2, 84%ILE, BPP 8/8. On pitocin 8 for cervical ripening, unable to place foley bulb cervix still closed.  Patient Active Problem  List   Diagnosis Date Noted   Morbid obesity with body mass index (BMI) of 40.0 or higher ([place]) 03/02/2021   Genital herpes simplex 05/30/2017   Female infertility 04/08/2016   ANEMIA, IRON DEFICIENCY 10/10/2007   NICHD: Category 1  Membranes: Intact, no s/s of infection  Induction:    Cytotec xN/A pt TOLAC, contraindicated  Foley Bulb: unable to place, cervix closed  Pitocin - 8  Pain management:               IV pain management: xPRN  Nitrous: PRN             Epidural placement: PRN  GBS Negative   Plan: Continue labor plan Continuous monitoring Rest Ambulate Frequent position changes to facilitate fetal rotation and descent. Will reassess with cervical exam at 4 hours or earlier if necessary Continue pitocin per protocol to max of 10 until cervix ripe Anticipate foley bulb once cervix open Anticipate AROM once fetal head in pelvis.  Anticipate labor progression and vaginal delivery.   Md Mancel Bale to be made aware of plan @0700   [facility], NP-C, CNM, MSN 03/02/2021. 6:02 AM

## 2021-03-02 NOTE — Progress Notes (Signed)
Labor Progress Note  Bonnie Evans is a 35 y.o. female, G2P1001,  IUP at 40 weeks, presenting for [place] for BMI>40.  H/O Obesity BMI 41, HSV on vlatreex, no prodromal s/sx or lesion, anemia at NOB 10.6 hgb on po iron, AMA unity WNL, Previous CS in 2018 due to HSV lesion LTCS with 2 layer closure. Ultrasound Images 02-17-2021 BPP--VTX, ANTERIOR PLACENTA, AFI 22.7, 8+2, 84%ILE, BPP 8/8.  Subjective: Pt still in bed, sleeping through cxt, pt not making cervical change on 10 of pitocin, pt instructed and encouraged to get out of bed and walk, use yoga ball and move around, pt endorses cxt are being felt but at time just like menstrual cramps. Discussed continuing to increase pitocin to max of 20, if cervix not open then take pitocin break. Eat and start over with new bag of pitocin. Patient Active Problem List   Diagnosis Date Noted   Morbid obesity with body mass index (BMI) of 40.0 or higher ([place]) 03/02/2021   Genital herpes simplex 05/30/2017   Female infertility 04/08/2016   ANEMIA, IRON DEFICIENCY 10/10/2007   Objective: BP (!) 122/59   Pulse 65   Temp 98.2 F (36.8 C) (Axillary)   Resp 16   Ht 5\' 4"  (1.626 m)   Wt 116.5 kg   SpO2 97%   BMI 44.08 kg/m  No intake/output data recorded. No intake/output data recorded. NST: FHR baseline 125 bpm, Variability: moderate, Accelerations:present, Decelerations:  Absent= Cat 1/Reactive CTX:  irregular, every 4-7 minutes, 40-70 seconds Uterus gravid, soft non tender, mild to palpate with contractions.  SVE:  Dilation: Closed Effacement (%): 50 Station: Ballotable Exam by:: [employer] CNM Pitocin at 12 mUn/min  Assessment:  Bonnie Evans is a 35 y.o. female, G2P1001,  IUP at 40 weeks, presenting for [place] for BMI>40.  H/O Obesity BMI 41, HSV on vlatreex, no prodromal s/sx or lesion, anemia at NOB 10.6 hgb on po iron, AMA unity WNL, Previous CS in 2018 due to HSV lesion LTCS with 2 layer closure. Ultrasound Images  02-17-2021 BPP--VTX, ANTERIOR PLACENTA, AFI 22.7, 8+2, 84%ILE, BPP 8/8. On pitocin 10 for cervical ripening, unable to place foley bulb cervix still closed. Encouraged movement.  Patient Active Problem List   Diagnosis Date Noted   Morbid obesity with body mass index (BMI) of 40.0 or higher ([place]) 03/02/2021   Genital herpes simplex 05/30/2017   Female infertility 04/08/2016   ANEMIA, IRON DEFICIENCY 10/10/2007   NICHD: Category 1  Membranes: Intact, no s/s of infection  Induction:    Cytotec xN/A pt TOLAC, contraindicated  Foley Bulb: unable to place, cervix closed  Pitocin - 12  Pain management:               IV pain management: x fentanyl at 0926, 1132  Nitrous: PRN             Epidural placement: PRN  GBS Negative   Plan: Continue labor plan Continuous monitoring Rest Ambulate Frequent position changes to facilitate fetal rotation and descent. Will reassess with cervical exam at 4 hours or earlier if necessary Continue pitocin per protocol to max of 20 until cervix ripe If cervix not open at max of 20 of pitocin, will take break eat dinner, then restart IOL from 1x1 for cervical ripening.  Anticipate foley bulb once cervix open Anticipate AROM once fetal head in pelvis.  Anticipate labor progression and vaginal delivery.   [name], NP-C, CNM, MSN 03/02/2021. 1:55 PM

## 2021-03-02 NOTE — Progress Notes (Signed)
Labor Progress Note  Bonnie Evans is a 35 y.o. female, G2P1001,  IUP at 40 weeks, presenting for [place] for BMI>40.  H/O Obesity BMI 41, HSV on vlatreex, no prodromal s/sx or lesion, anemia at NOB 10.6 hgb on po iron, AMA unity WNL, Previous CS in 2018 due to HSV lesion LTCS with 2 layer closure. Ultrasound Images 02-17-2021 BPP--VTX, ANTERIOR PLACENTA, AFI 22.7, 8+2, 84%ILE, BPP 8/8.  Subjective: Pt up walking around, and using yoga ball, pt stable feeling cxt, consented to foley bulb placement. Foley placed, pt tolerated well, in early labor at 2cm dilated, pit was on 18 and turned off, pit break for pt to eat. Continue with foley bulb till out.  Patient Active Problem List   Diagnosis Date Noted   Morbid obesity with body mass index (BMI) of 40.0 or higher ([place]) 03/02/2021   Genital herpes simplex 05/30/2017   Female infertility 04/08/2016   ANEMIA, IRON DEFICIENCY 10/10/2007   Objective: BP (!) 145/70   Pulse 82   Temp 97.6 F (36.4 C) (Oral)   Resp 16   Ht 5\' 4"  (1.626 m)   Wt 116.5 kg   SpO2 97%   BMI 44.08 kg/m  No intake/output data recorded. No intake/output data recorded. NST: FHR baseline 145 bpm, Variability: moderate, Accelerations:present, Decelerations:  Absent= Cat 1/Reactive CTX:  irregular, every 3-7 minutes, 40-70 seconds Uterus gravid, soft non tender, mild to palpate with contractions.  SVE:  Dilation: 2 Effacement (%): 50 Station: Ballotable Exam by:: [employer] CNM Pitocin at 18 then stopped mUn/min Foley bulb placed with ease, cook cath with 60mls NS in internal balloon and 69ml ns in external balloon.   Assessment:  Bonnie Evans is a 35 y.o. female, G2P1001,  IUP at 40 weeks, presenting for TOLAC IOL for BMI>40.  H/O Obesity BMI 41, HSV on vlatreex, no prodromal s/sx or lesion, anemia at NOB 10.6 hgb on po iron, AMA unity WNL, Previous CS in 2018 due to HSV lesion LTCS with 2 layer closure. Ultrasound Images 02-17-2021 BPP--VTX, ANTERIOR  PLACENTA, AFI 22.7, 8+2, 84%ILE, BPP 8/8. On pitocin 18pit stopped for pit break and food, foley bulb placed.  Patient Active Problem List   Diagnosis Date Noted   Morbid obesity with body mass index (BMI) of 40.0 or higher ([place]) 03/02/2021   Genital herpes simplex 05/30/2017   Female infertility 04/08/2016   ANEMIA, IRON DEFICIENCY 10/10/2007   NICHD: Category 1  Membranes: Intact, no s/s of infection  Induction:    Cytotec xN/A pt TOLAC, contraindicated  Foley Bulb: placed @ 1830 6/22  Pitocin - 18 then stopped @ 7824 on 6/22  Pain management:    IV pain management: x fentanyl at 0926, 1132, 1826, stadol 1mg  IV given at 2000  Nitrous: PRN             Epidural placement: PRN  GBS Negative   Plan: Continue labor plan Continuous monitoring Rest Ambulate Frequent position changes to facilitate fetal rotation and descent. Will reassess with cervical exam at 6-8 hours or earlier if necessary D/C pitocin Anticipate foley bulb out in 12 hours, then plan to restart pitocin 1x1 Anticipate AROM once fetal head in pelvis Anticipate labor progression and vaginal delivery.   [name], NP-C, CNM, MSN 03/02/2021. 7:48 PM

## 2021-03-03 ENCOUNTER — Inpatient Hospital Stay (HOSPITAL_COMMUNITY): Payer: BC Managed Care – PPO | Admitting: Anesthesiology

## 2021-03-03 MED ORDER — LIDOCAINE HCL (PF) 1 % IJ SOLN
INTRAMUSCULAR | Status: DC | PRN
  Administered 2021-03-03: 5 mL via EPIDURAL
  Administered 2021-03-03: 4 mL via EPIDURAL

## 2021-03-03 MED ORDER — OXYTOCIN-SODIUM CHLORIDE 30-0.9 UT/500ML-% IV SOLN
1.0000 m[IU]/min | INTRAVENOUS | Status: DC
Start: 2021-03-03 — End: 2021-03-03
  Administered 2021-03-03: 1 m[IU]/min via INTRAVENOUS

## 2021-03-03 MED ORDER — OXYTOCIN-SODIUM CHLORIDE 30-0.9 UT/500ML-% IV SOLN
1.0000 m[IU]/min | INTRAVENOUS | Status: DC
  Administered 2021-03-03: 2 m[IU]/min via INTRAVENOUS
  Administered 2021-03-04: 1 m[IU]/min via INTRAVENOUS

## 2021-03-03 MED ORDER — ACETAMINOPHEN 500 MG PO TABS
1000.0000 mg | ORAL_TABLET | Freq: Once | ORAL | Status: AC
  Administered 2021-03-03: 1000 mg via ORAL

## 2021-03-03 MED ORDER — ACETAMINOPHEN 500 MG PO TABS
1000.0000 mg | ORAL_TABLET | Freq: Four times a day (QID) | ORAL | Status: DC | PRN
  Filled 2021-03-03: qty 2

## 2021-03-03 NOTE — Progress Notes (Signed)
Pt. Taken off the monitor to eat and shower per Burman Foster CNM. Pt. D/c from pitocin at this time and we will restart her induction after.

## 2021-03-03 NOTE — Progress Notes (Signed)
Labor Progress Note  Bonnie Evans is a 35 y.o. female, G2P1001,  IUP at 40 weeks, presenting for [place] for BMI>40.  H/O Obesity BMI 41, HSV on vlatreex, no prodromal s/sx or lesion, anemia at NOB 10.6 hgb on po iron, AMA unity WNL, Previous CS in 2018 due to HSV lesion LTCS with 2 layer closure. Ultrasound Images 02-17-2021 BPP--VTX, ANTERIOR PLACENTA, AFI 22.7, 8+2, 84%ILE, BPP 8/8.  Subjective: Pt sleeping with foley bulb still in place. Pt able to sleep through cxt.  Patient Active Problem List   Diagnosis Date Noted   Morbid obesity with body mass index (BMI) of 40.0 or higher ([place]) 03/02/2021   Genital herpes simplex 05/30/2017   Female infertility 04/08/2016   ANEMIA, IRON DEFICIENCY 10/10/2007   Objective: BP 117/61   Pulse 70   Temp 98.4 F (36.9 C) (Axillary)   Resp 16   Ht 5\' 4"  (1.626 m)   Wt 116.5 kg   SpO2 97%   BMI 44.08 kg/m  No intake/output data recorded. No intake/output data recorded. NST: FHR baseline 150 bpm, Variability: moderate, Accelerations:present, Decelerations:  Absent= Cat 1/Reactive CTX:  irregular, every 4-7 minutes, 40-70 seconds Uterus gravid, soft non tender, mild to palpate with contractions.  SVE:  Dilation: 2 Effacement (%): 50 Station: Ballotable Exam by:: [employer] CNM Pitocin at 0 mUn/min Foley bulb still in place, traction applied.   Assessment:  Bonnie Evans is a 35 y.o. female, G2P1001,  IUP at 40 weeks, presenting for TOLAC IOL for BMI>40.  H/O Obesity BMI 41, HSV on vlatreex, no prodromal s/sx or lesion, anemia at NOB 10.6 hgb on po iron, AMA unity WNL, Previous CS in 2018 due to HSV lesion LTCS with 2 layer closure. Ultrasound Images 02-17-2021 BPP--VTX, ANTERIOR PLACENTA, AFI 22.7, 8+2, 84%ILE, BPP 8/8. Progressing in latent labor with foley bulb In place.  Patient Active Problem List   Diagnosis Date Noted   Morbid obesity with body mass index (BMI) of 40.0 or higher ([place]) 03/02/2021   Genital herpes simplex  05/30/2017   Female infertility 04/08/2016   ANEMIA, IRON DEFICIENCY 10/10/2007   NICHD: Category 1  Membranes: Intact, no s/s of infection  Induction:    Cytotec xN/A pt TOLAC, contraindicated  Foley Bulb: placed @ 1830 6/22  Pitocin - 18 then stopped @ 8315 on 6/22, currently 0  Pain management:    IV pain management: x fentanyl at 0926, 1132, 1826, stadol 1mg  IV given at 2000  Nitrous: PRN             Epidural placement: PRN  GBS Negative   Plan: Continue labor plan Continuous monitoring Rest Ambulate Frequent position changes to facilitate fetal rotation and descent. Will reassess with cervical exam at 4 hours or earlier if necessary Anticipate foley bulb out in 7 hours, then plan to restart pitocin 1x1 Anticipate AROM once fetal head in pelvis Anticipate labor progression and vaginal delivery.   [name], NP-C, CNM, MSN 03/03/2021. 3:33 AM

## 2021-03-03 NOTE — Progress Notes (Addendum)
Subjective:    Comfortable w/ epidural, discussed amniotomy and pt agrees.   Objective:    VS: BP 123/70   Pulse 77   Temp 98.7 F (37.1 C) (Oral)   Resp 16   Ht 5\' 4"  (1.626 m)   Wt 116.5 kg   SpO2 99%   BMI 44.08 kg/m  FHR : baseline 140 / variability moderate / accelerations present / absent decelerations Toco: contractions every 2-3 minutes  Membranes: AROM, clear Dilation: 6 Effacement (%): 90 Cervical Position: Middle Station: -2 Presentation: Vertex Exam by:: Burman Foster CNM and Chi Morris RN Pitocin 12 mU/min  Assessment/Plan:   35 y.o. G2P1001 [redacted]w[redacted]d  Labor:  progressing on Pitocin Fetal Wellbeing:  Category I Pain Control:  Epidural I/D:   GBS negative Anticipated MOD:  NSVD  Arrie Eastern MSN, CNM 03/03/2021 5:37 PM  MD Addendum: I reviewed chart and EFM and agree with above findings, assessment and plan as outlined above by [place] MSN, CNM.   Dr. Alesia Richards.   03/03/2021.  06:41 PM.

## 2021-03-03 NOTE — Anesthesia Preprocedure Evaluation (Addendum)
Anesthesia Evaluation  Patient identified by MRN, date of birth, ID band Patient awake    Reviewed: Allergy & Precautions, NPO status , Patient's Chart, lab work & pertinent test results  History of Anesthesia Complications Negative for: history of anesthetic complications  Airway Mallampati: II   Neck ROM: Full    Dental   Pulmonary neg pulmonary ROS,    Pulmonary exam normal        Cardiovascular negative cardio ROS Normal cardiovascular exam     Neuro/Psych negative neurological ROS  negative psych ROS   GI/Hepatic negative GI ROS, Neg liver ROS,   Endo/Other  Morbid obesity  Renal/GU negative Renal ROS     Musculoskeletal negative musculoskeletal ROS (+)   Abdominal (+) + obese,   Peds  Hematology  (+) anemia ,  Plt 466k    Anesthesia Other Findings HSV Covid test negative   Reproductive/Obstetrics                             Anesthesia Physical Anesthesia Plan  ASA: 3  Anesthesia Plan: Epidural   Post-op Pain Management:    Induction:   PONV Risk Score and Plan: 2 and Treatment may vary due to age or medical condition  Airway Management Planned: Natural Airway  Additional Equipment: None  Intra-op Plan:   Post-operative Plan:   Informed Consent: I have reviewed the patients History and Physical, chart, labs and discussed the procedure including the risks, benefits and alternatives for the proposed anesthesia with the patient or authorized representative who has indicated his/her understanding and acceptance.       Plan Discussed with: Anesthesiologist  Anesthesia Plan Comments: (Labs reviewed. Platelets acceptable, patient not taking any blood thinning medications. Per RN, FHR tracing reported to be stable enough for sitting procedure. Risks and benefits discussed with patient, including PDPH, backache, epidural hematoma, failed epidural, blood pressure changes,  allergic reaction, and nerve injury. Patient expressed understanding and wished to proceed.)       Anesthesia Quick Evaluation

## 2021-03-03 NOTE — Progress Notes (Signed)
Delay in starting induction. RN unable to verify presentation with sterile vaginal exam. Will get bedside ultrasound to verify at this time.

## 2021-03-03 NOTE — Progress Notes (Addendum)
Subjective:    Reports a restful night, spouse left to care for son at home. Discussed having a shower and light meal, pt agrees. Denies pain currently, plans epidural for active labor.   Objective:    VS: BP 140/69   Pulse 72   Temp 98.4 F (36.9 C) (Axillary)   Resp 16   Ht 5\' 4"  (1.626 m)   Wt 116.5 kg   SpO2 97%   BMI 44.08 kg/m  FHR : baseline 140 / variability moderate / accelerations present / absent decelerations Toco: contractions irreg Membranes: intact Dilation: 3 Effacement (%): 60 Cervical Position: Posterior Station: Ballotable Presentation: Vertex Exam by:: [employer], CNM Pitocin off  Assessment/Plan:   35 y.o. G2P1001 [redacted]w[redacted]d Term IOL TOLAC    -primary section for HSV outbreak Anemia    -admission Hgb 9.1    -plan for AMTSL to minimize blood loss  Labor:  S/P low-dose Pitocin, cook balloon x 12 hrs, restarted Pitocin this AM. Pitocin dc;d now for shower and light breakfast. Will resume Pitocin when self-care is complete. Fetal Wellbeing:  Category I Pain Control:   S/P IV sedation, plans epidural I/D:   GBS neg Anticipated MOD:  NSVD  Arrie Eastern MSN, CNM 03/03/2021 8:16 AM

## 2021-03-03 NOTE — Progress Notes (Signed)
Subjective:    Comfortable w/ epidural.   Objective:    VS: BP 127/68   Pulse 69   Temp 98.4 F (36.9 C) (Axillary)   Resp 16   Ht 5\' 4"  (1.626 m)   Wt 116.5 kg   SpO2 99%   BMI 44.08 kg/m  FHR : baseline 145 / variability moderate / accelerations present / absent decelerations Toco: contractions every 3-5 minutes  Membranes: intact Dilation: 5 Effacement (%): 70 Cervical Position: Posterior Station: -3 Presentation: Vertex Exam by:: Burman Foster Pitocin 4 mU/min  Assessment/Plan:   35 y.o. G2P1001 [redacted]w[redacted]d  Term IOL TOLAC    -primary section for HSV outbreak Anemia    -admission Hgb 9.1    -plan for AMTSL to minimize blood loss   Labor:  S/P low-dose Pitocin, cook balloon x 12 hrs, restarted Pitocin this AM.  Fetal Wellbeing:  Category I Pain Control:  epidural I/D:   GBS neg Anticipated MOD:  NSVD  Arrie Eastern MSN, CNM 03/03/2021 1:40 PM

## 2021-03-03 NOTE — Anesthesia Procedure Notes (Signed)
Epidural Patient location during procedure: OB Start time: 03/03/2021 12:56 PM End time: 03/03/2021 12:59 PM  Staffing Anesthesiologist: Audry Pili, MD Performed: anesthesiologist   Preanesthetic Checklist Completed: patient identified, IV checked, risks and benefits discussed, monitors and equipment checked, pre-op evaluation and timeout performed  Epidural Patient position: sitting Prep: DuraPrep Patient monitoring: continuous pulse ox and blood pressure Approach: midline Location: L2-L3 Injection technique: LOR saline  Needle:  Needle type: Tuohy  Needle gauge: 17 G Needle length: 9 cm Needle insertion depth: 8 cm Catheter size: 19 Gauge Catheter at skin depth: 13 cm Test dose: negative and Other (1% lidocaine)  Assessment Events: blood not aspirated  Additional Notes Patient identified. Risks including, but not limited to, bleeding, infection, nerve damage, paralysis, inadequate analgesia, blood pressure changes, nausea, vomiting, allergic reaction, postpartum back pain, itching, and headache were discussed. Patient expressed understanding and wished to proceed. Sterile prep and drape, including hand hygiene, mask, and sterile gloves were used. The patient was positioned and the spine was prepped. The skin was anesthetized with lidocaine. No paraesthesia or other complication noted. The patient did not experience any signs of intravascular injection such as tinnitus or metallic taste in mouth, nor signs of intrathecal spread such as rapid motor block. Please see nursing notes for vital signs. The patient tolerated the procedure well.   Renold Don, MDReason for block:procedure for pain

## 2021-03-03 NOTE — Progress Notes (Signed)
Labor Progress Note  Bonnie Evans is a 35 y.o. female, G2P1001,  IUP at 40 weeks, presenting for [place] for BMI>40.  H/O Obesity BMI 41, HSV on vlatreex, no prodromal s/sx or lesion, anemia at NOB 10.6 hgb on po iron, AMA unity WNL, Previous CS in 2018 due to HSV lesion LTCS with 2 layer closure. Ultrasound Images 02-17-2021 BPP--VTX, ANTERIOR PLACENTA, AFI 22.7, 8+2, 84%ILE, BPP 8/8.  Subjective: Pt awake, endorses till having cxt, able to breath through them, irregular every 6-8 mins, foley bulb still in place, plan to take out, been in 12 hours. Restart pitocin.  Patient Active Problem List   Diagnosis Date Noted   Morbid obesity with body mass index (BMI) of 40.0 or higher ([place]) 03/02/2021   Genital herpes simplex 05/30/2017   Female infertility 04/08/2016   ANEMIA, IRON DEFICIENCY 10/10/2007   Objective: BP 117/61   Pulse 70   Temp 98.4 F (36.9 C) (Axillary)   Resp 16   Ht 5\' 4"  (1.626 m)   Wt 116.5 kg   SpO2 97%   BMI 44.08 kg/m  No intake/output data recorded. No intake/output data recorded. NST: FHR baseline 150 bpm, Variability: moderate, Accelerations:present, Decelerations:  Absent= Cat 1/Reactive CTX:  irregular, every 6-8 minutes, 40-70 seconds Uterus gravid, soft non tender, mild to palpate with contractions.  SVE:  Dilation: 3 Effacement (%): 60 Station: Ballotable Exam by:: [employer], CNM Pitocin at Plan to restart now 1x1 mUn/min Foley bulb deflated and removed Bedside US verified vertex.   Assessment:  Bonnie Evans is a 35 y.o. female, G2P1001,  IUP at 40 weeks, presenting for TOLAC IOL for BMI>40.  H/O Obesity BMI 41, HSV on vlatreex, no prodromal s/sx or lesion, anemia at NOB 10.6 hgb on po iron, AMA unity WNL, Previous CS in 2018 due to HSV lesion LTCS with 2 layer closure. Ultrasound Images 02-17-2021 BPP--VTX, ANTERIOR PLACENTA, AFI 22.7, 8+2, 84%ILE, BPP 8/8. Progressing in latent labor with foley bulb removed, will restart pit.  Patient  Active Problem List   Diagnosis Date Noted   Morbid obesity with body mass index (BMI) of 40.0 or higher ([place]) 03/02/2021   Genital herpes simplex 05/30/2017   Female infertility 04/08/2016   ANEMIA, IRON DEFICIENCY 10/10/2007   NICHD: Category 1  Membranes: Intact, no s/s of infection  Induction:    Cytotec xN/A pt TOLAC, contraindicated  Foley Bulb: placed @ 4496 6/22, removed 0630 6/23  Pitocin - 18 then stopped @ 7591 on 6/22, currently 0, pit restarted now at 1  Pain management:    IV pain management: x fentanyl at 0926, 1132, 1826, stadol 1mg  IV given at 2000  Nitrous: PRN             Epidural placement: PRN  GBS Negative   Plan: Continue labor plan Continuous monitoring Rest Ambulate Frequent position changes to facilitate fetal rotation and descent. Will reassess with cervical exam at 4 hours or earlier if necessary Restart pitocin 1x1 to further cervical ripening, max 20 Anticipate AROM once fetal head in pelvis Anticipate labor progression and vaginal delivery.   DR Fritzi Mandes CNM to assume care at 0700 and will be given report.   [name], NP-C, CNM, MSN 03/03/2021. 6:20 AM

## 2021-03-04 ENCOUNTER — Encounter (HOSPITAL_COMMUNITY): Payer: Self-pay | Admitting: Obstetrics & Gynecology

## 2021-03-04 MED ORDER — TRANEXAMIC ACID-NACL 1000-0.7 MG/100ML-% IV SOLN
INTRAVENOUS | Status: AC
  Filled 2021-03-04: qty 100

## 2021-03-04 MED ORDER — WITCH HAZEL-GLYCERIN EX PADS
1.0000 "application " | MEDICATED_PAD | CUTANEOUS | Status: DC | PRN
  Administered 2021-03-06: 1 via TOPICAL

## 2021-03-04 MED ORDER — IBUPROFEN 600 MG PO TABS
600.0000 mg | ORAL_TABLET | Freq: Four times a day (QID) | ORAL | Status: DC
  Administered 2021-03-04 – 2021-03-06 (×8): 600 mg via ORAL
  Filled 2021-03-04 (×9): qty 1

## 2021-03-04 MED ORDER — SENNOSIDES-DOCUSATE SODIUM 8.6-50 MG PO TABS
2.0000 | ORAL_TABLET | Freq: Every day | ORAL | Status: DC
  Administered 2021-03-05 – 2021-03-06 (×2): 2 via ORAL
  Filled 2021-03-04 (×2): qty 2

## 2021-03-04 MED ORDER — DIBUCAINE (PERIANAL) 1 % EX OINT: 1.0000 "application " | TOPICAL_OINTMENT | CUTANEOUS | Status: DC | PRN

## 2021-03-04 MED ORDER — ONDANSETRON HCL 4 MG/2ML IJ SOLN: 4.0000 mg | INTRAMUSCULAR | Status: DC | PRN

## 2021-03-04 MED ORDER — OXYTOCIN-SODIUM CHLORIDE 30-0.9 UT/500ML-% IV SOLN: 1.0000 m[IU]/min | INTRAVENOUS | Status: DC

## 2021-03-04 MED ORDER — BENZOCAINE-MENTHOL 20-0.5 % EX AERO
1.0000 "application " | INHALATION_SPRAY | CUTANEOUS | Status: DC | PRN
  Administered 2021-03-06: 1 via TOPICAL
  Filled 2021-03-04: qty 56

## 2021-03-04 MED ORDER — FERROUS SULFATE 325 (65 FE) MG PO TABS
325.0000 mg | ORAL_TABLET | Freq: Two times a day (BID) | ORAL | Status: DC
  Administered 2021-03-05 (×2): 325 mg via ORAL
  Filled 2021-03-04 (×2): qty 1

## 2021-03-04 MED ORDER — OXYCODONE HCL 5 MG PO TABS
5.0000 mg | ORAL_TABLET | ORAL | Status: DC | PRN
  Administered 2021-03-04: 5 mg via ORAL
  Filled 2021-03-04: qty 1

## 2021-03-04 MED ORDER — ONDANSETRON HCL 4 MG PO TABS: 4.0000 mg | ORAL_TABLET | ORAL | Status: DC | PRN

## 2021-03-04 MED ORDER — ZOLPIDEM TARTRATE 5 MG PO TABS: 5.0000 mg | ORAL_TABLET | Freq: Every evening | ORAL | Status: DC | PRN

## 2021-03-04 MED ORDER — ACETAMINOPHEN 325 MG PO TABS
650.0000 mg | ORAL_TABLET | ORAL | Status: DC | PRN
  Administered 2021-03-04 – 2021-03-05 (×4): 650 mg via ORAL
  Filled 2021-03-04 (×4): qty 2

## 2021-03-04 MED ORDER — TETANUS-DIPHTH-ACELL PERTUSSIS 5-2.5-18.5 LF-MCG/0.5 IM SUSY: 0.5000 mL | PREFILLED_SYRINGE | Freq: Once | INTRAMUSCULAR | Status: DC

## 2021-03-04 MED ORDER — OXYCODONE HCL 5 MG PO TABS: 10.0000 mg | ORAL_TABLET | ORAL | Status: DC | PRN

## 2021-03-04 MED ORDER — DIPHENHYDRAMINE HCL 25 MG PO CAPS: 25.0000 mg | ORAL_CAPSULE | Freq: Four times a day (QID) | ORAL | Status: DC | PRN

## 2021-03-04 MED ORDER — PRENATAL MULTIVITAMIN CH
1.0000 | ORAL_TABLET | Freq: Every day | ORAL | Status: DC
  Administered 2021-03-05 – 2021-03-06 (×2): 1 via ORAL
  Filled 2021-03-04 (×3): qty 1

## 2021-03-04 MED ORDER — SIMETHICONE 80 MG PO CHEW
80.0000 mg | CHEWABLE_TABLET | ORAL | Status: DC | PRN
  Filled 2021-03-04: qty 1

## 2021-03-04 MED ORDER — COCONUT OIL OIL: 1.0000 "application " | TOPICAL_OIL | Status: DC | PRN

## 2021-03-04 NOTE — Lactation Note (Signed)
This note was copied from a baby's chart. Lactation Consultation Note  Patient Name: Bonnie Evans [id number] Date: 03/04/2021 Reason for consult: L&D Initial assessment;Term Age:35 hours  L&D consult with >60 minutes old infant and P2 mother. Congratulated mother on her newborn. Infant is skin to skin prone on mother's chest. Discussed STS as ideal transition for infants after birth helping with temperature, blood sugar and comfort. Talked about primal reflexes such as rooting, hands to mouth, searching for the breast among others. LC provided hand expression assistance and spoonfeed at this time. Infant did not latch after several attempts.  Explained [facility] availability during postpartum stay. Thanked family for their time.    Maternal Data Has patient been taught Hand Expression?: Yes Does the patient have breastfeeding experience prior to this delivery?: Yes How long did the patient breastfeed?: first child for 3 years, stopped with this pregnancy  Feeding Mother's Current Feeding Choice: Breast Milk  LATCH Score Latch: Repeated attempts needed to sustain latch, nipple held in mouth throughout feeding, stimulation needed to elicit sucking reflex.  Audible Swallowing: None  Type of Nipple: Everted at rest and after stimulation (short shafted)  Comfort (Breast/Nipple): Soft / non-tender  Hold (Positioning): Assistance needed to correctly position infant at breast and maintain latch.  LATCH Score: 6  Interventions Interventions: Breast feeding basics reviewed;Assisted with latch;Skin to skin;Breast massage;Hand express;Expressed milk;Education  Discharge Pump: Personal WIC Program: No  Consult Status Consult Status: Follow-up Date: 03/04/21 Follow-up type: In-patient    Abbigal Radich A Higuera Ancidey 03/04/2021, 4:10 PM

## 2021-03-04 NOTE — Progress Notes (Addendum)
Subjective:    Pt resting comfortable with epidural. SVE with pt consent. 8/80/0, with moderate amount of amniotic fluid noted.  IUPC remains in place. MVU's now adequate. Peanut ball in place.  Objective:    VS: BP 140/73   Pulse 77   Temp 99 F (37.2 C) (Axillary)   Resp 15   Ht 5\' 4"  (1.626 m)   Wt 116.5 kg   SpO2 99%   BMI 44.08 kg/m  FHR : baseline 150 / variability moderate / accelerations present / absent decelerations Toco: contractions every 2-4 minutes / MVU 190 Membranes: AROM on 03/03/21 Dilation: 8 Effacement (%): 80 Cervical Position: Middle Station: 0 Presentation: Vertex Exam by:: Kim Tinzlee Craker CNM Pitocin 9 mU/min  Assessment/Plan:   35 y.o. 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 Adequate MVU's on 29mu pitocin. Will keep pitocin at 36mu as long as adequate. Preeclampsia:  no signs or symptoms of toxicity Fetal Wellbeing:  Category I Pain Control:  Epidural I/D:  GBS negative Anticipated MOD:  NSVD Dr Charlesetta Garibaldi updated on pt status and POC.  Domingo Pulse MSN, CNM 03/04/2021 8:36 AM

## 2021-03-04 NOTE — Progress Notes (Signed)
Subjective:    Remains comfortable w/ epidural. Discussed change in amniotic fluid color from clear to light meconium and management at birth. Questions answered. Assisted RN with complete bed change and positioned pt in left exaggerated Sims w/ peanut ball.   Objective:    VS: BP 128/70   Pulse 78   Temp 99.7 F (37.6 C) (Axillary)   Resp 15   Ht 5\' 4"  (1.626 m)   Wt 116.5 kg   SpO2 99%   BMI 44.08 kg/m  FHR : baseline 150 / variability moderate / accelerations present / absent decelerations IUPC: contractions every 2-3.5 minutes / MVU 140 Membranes: AROM x10 hrs, light mec Dilation: 7.5 Effacement (%): 90 Cervical Position: Middle Station: -2 Presentation: Vertex Exam by:: Clois Dupes, CNM Pitocin 5 mU/min  Assessment/Plan:   35 y.o. G2P1001 [redacted]w[redacted]d  Labor:  protracted active phase, not adequate Fetal Wellbeing:  Category I Pain Control:  Epidural I/D:   GBS neg Anticipated MOD:  NSVD  Bonnie Eastern MSN, CNM 03/04/2021 4:52 AM

## 2021-03-04 NOTE — Progress Notes (Signed)
Subjective:    Resting and reports adequate pain relief from epidural. Discussed IUPC to manage Pitocin and pt agrees.   Objective:    VS: BP 126/66 (BP Location: Left Arm)   Pulse 78   Temp 98.4 F (36.9 C) (Axillary)   Resp 15   Ht 5\' 4"  (1.626 m)   Wt 116.5 kg   SpO2 99%   BMI 44.08 kg/m  FHR : baseline 140 / variability moderate / accelerations present / absent decelerations IUPC: contractions every 3-5 minutes  Membranes: AROM x 7 hrs, light mec noted Dilation: 7.5 Effacement (%): 90 Cervical Position: Middle Station: -2 Presentation: Vertex Exam by:: Clois Dupes, CNM   Assessment/Plan:   35 y.o. G2P1001 [redacted]w[redacted]d IOL for BMI of 44 Hx of primary C/S for HSV outbreak, desires TOLAC Anemia-plan AMTSL  Labor:  Progressing, IUPC placed for Pitocin titration Fetal Wellbeing:  Category I Pain Control:  Epidural I/D:   GBS neg Anticipated MOD:  NSVD  Bonnie Eastern MSN, CNM 03/04/2021 12:22 AM

## 2021-03-05 LAB — CBC
HCT: 26.1 % — ABNORMAL LOW (ref 36.0–46.0)
Hemoglobin: 8.5 g/dL — ABNORMAL LOW (ref 12.0–15.0)
MCH: 28.3 pg (ref 26.0–34.0)
MCHC: 32.6 g/dL (ref 30.0–36.0)
MCV: 87 fL (ref 80.0–100.0)
Platelets: 351 10*3/uL (ref 150–400)
RBC: 3 MIL/uL — ABNORMAL LOW (ref 3.87–5.11)
RDW: 14.6 % (ref 11.5–15.5)
WBC: 17.5 10*3/uL — ABNORMAL HIGH (ref 4.0–10.5)
nRBC: 0 % (ref 0.0–0.2)

## 2021-03-05 MED ORDER — CYCLOBENZAPRINE HCL 5 MG PO TABS
10.0000 mg | ORAL_TABLET | Freq: Once | ORAL | Status: AC
  Administered 2021-03-05: 10 mg via ORAL
  Filled 2021-03-05: qty 2

## 2021-03-05 NOTE — Progress Notes (Signed)
Patient is still having trouble walking and is unable to bear weight on her right leg.  She states she is having pain in her upper right leg and hip.  RN notified Burman Foster CNM, and she stated she would come see patient.  Ulysse Siemen, [place]

## 2021-03-05 NOTE — Anesthesia Postprocedure Evaluation (Signed)
Anesthesia Post Note  Patient: Bonnie Evans  Procedure(s) Performed: AN AD [place]     Patient location during evaluation: Mother Baby Anesthesia Type: Epidural Level of consciousness: awake and alert, oriented and patient cooperative Pain management: pain level controlled Vital Signs Assessment: post-procedure vital signs reviewed and stable Respiratory status: spontaneous breathing Cardiovascular status: stable Postop Assessment: no headache, epidural receding, patient able to bend at knees and no signs of nausea or vomiting Anesthetic complications: no Comments: Pt. States she is walking. Pain score 6.  Pt. Encouraged to communicate pain needs to bedside RN.    No notable events documented.  Last Vitals:  Vitals:   03/04/21 2215 03/05/21 0229  BP: 138/80 131/73  Pulse: 66 67  Resp: 20 20  Temp: 36.7 C 36.4 C  SpO2: 99% 99%    Last Pain:  Vitals:   03/05/21 0543  TempSrc:   PainSc: 5    Pain Goal: Patients Stated Pain Goal: 0 (03/02/21 1718)                 Rico Sheehan

## 2021-03-05 NOTE — Progress Notes (Signed)
PPD# 1 SVD w/ 1st degree laceration Information for the patient's newborn:  Tierra, Thoma [[id number]]  female    Circumcision to be performed by Pediatrician   S:   Reports feeling very sleepy Tolerating PO fluid and solids No nausea or vomiting Bleeding is light, no clots Pain controlled with  PO meds Up ad lib / ambulatory / voiding w/o difficulty Feeding: Breast    O:   VS: BP 131/73 (BP Location: Left Arm)   Pulse 67   Temp 97.6 F (36.4 C) (Oral)   Resp 20   Ht 5\' 4"  (1.626 m)   Wt 116.5 kg   SpO2 99%   Breastfeeding Unknown   BMI 44.08 kg/m   LABS:  Recent Labs    03/05/21 0428  WBC 17.5*  HGB 8.5*  PLT 351   Blood type: --/--/B POS (06/22 0015) Rubella: Immune (12/02 0000)                      I&O: Intake/Output      06/24 0701 06/25 0700 06/25 0701 06/26 0700   I.V. (mL/kg) 363.6 (3.1)    Other 289.8    Total Intake(mL/kg) 653.4 (5.6)    Urine (mL/kg/hr) 500 (0.2)    Blood 250    Total Output 750    Net -96.6         Urine Occurrence 3 x      Physical Exam: Alert and oriented X3 Lungs: Clear and unlabored Heart: regular rate and rhythm / no mumurs Abdomen: soft, non-tender, non-distended  Fundus: firm, non-tender, midline, U-2 Perineum: well-approximated Lochia: appropriate Extremities: 1+ edema, no calf pain, tenderness, or cords    A:  PPD # 1    -Normal exam VBAC  P:  Routine post partum orders Anticipate D/C on 03/06/21   Arrie Eastern, MSN, CNM 03/05/2021, 11:41 AM

## 2021-03-05 NOTE — Progress Notes (Signed)
RN assessed patient's mobility again, she states that she can move a little better and her pain has decreased to about a 5 out of 10. She still requires the stedy to the bathroom and moderate assistance for mobility.  RN will continue to monitor patient's progress.  Haron Beilke, [place]

## 2021-03-05 NOTE — Progress Notes (Signed)
Notified that patient reports difficulty with ambulation. Patient evaluated in room 412 and reports hip pain. Motor and sensation intact to bilateral lower extremities. Issues likely unrelated to epidural anesthesia.

## 2021-03-06 MED ORDER — MAGNESIUM OXIDE -MG SUPPLEMENT 400 (240 MG) MG PO TABS
400.0000 mg | ORAL_TABLET | Freq: Every day | ORAL | Status: DC
  Administered 2021-03-06: 400 mg via ORAL
  Filled 2021-03-06: qty 1

## 2021-03-06 MED ORDER — POLYSACCHARIDE IRON COMPLEX 150 MG PO CAPS
150.0000 mg | ORAL_CAPSULE | Freq: Every day | ORAL | Status: DC
  Administered 2021-03-06: 150 mg via ORAL
  Filled 2021-03-06: qty 1

## 2021-03-06 MED ORDER — IBUPROFEN 600 MG PO TABS: 600.0000 mg | ORAL_TABLET | Freq: Four times a day (QID) | ORAL | 0 refills | Status: AC

## 2021-03-06 NOTE — Progress Notes (Signed)
Patient found OOB walking and dressed in BR on own and states she wants to discharge home, states midwife was in to see her this am

## 2021-03-06 NOTE — Progress Notes (Signed)
Assisted patient to BR to void and am care . Patient states pain improving on right hip and leg, pain 4/10 with ambulation. Able to walk slowly with nursing assistance, but not on own,able to squat and flex knee.

## 2021-03-06 NOTE — Discharge Summary (Signed)
[facility] Discharge Summary     Patient Name: Bonnie Evans DOB: Jan 28, 1986 MRN: [id number]  Date of admission: 03/02/2021 Delivering MD: Bonnie Evans  Date of delivery: 03/04/2021 Type of delivery: VBAC  Newborn Data: Sex: Baby female Circumcision: out pt circ desired Live born female  Birth Weight: 8 lb 6 oz (3799 g) APGAR: 5, 8  Newborn Delivery   Birth date/time: 03/04/2021 14:57:00 Delivery type: VBAC, Spontaneous      Feeding: breast and bottle Infant being discharge to home with mother in stable condition.   Admitting diagnosis: Morbid obesity with body mass index (BMI) of 40.0 or higher ([place]) [E66.01] Intrauterine pregnancy: [redacted]w[redacted]d     Secondary diagnosis:  Principal Problem:   Encounter for planned induction of labor Active Problems:   IDA (iron deficiency anemia)   Morbid obesity with body mass index (BMI) of 40.0 or higher ([facility])   SVD (spontaneous vaginal delivery)   Normal postpartum course                                Complications: None                                                              Intrapartum Procedures: spontaneous vaginal delivery Postpartum Procedures: none Complications-Operative and Postpartum: none Augmentation: AROM, Pitocin, and IP Foley   History of Present Illness: Ms. Bonnie Evans is a 35 y.o. female, G2P2002, who presents at [redacted]w[redacted]d weeks gestation. 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 yo G2P2002 at [redacted]w[redacted]d was admitted to the hospital 03/02/2021 for induction of labor.  Indication for induction:  obesity bmi 41, and TOLAC .  Patient had an uncomplicated labor course as follows: Membrane Rupture Time/Date: 5:22 PM ,03/03/2021   Delivery Method:VBAC, Spontaneous  Episiotomy: None  Lacerations:    Details of delivery can be found in separate delivery note.  Patient had a routine postpartum course. Patient is discharged home 03/06/21.  Newborn Data: Birth date:03/04/2021  Birth time:2:57 PM  Gender:Female  Living status:Living  Apgars:5 ,8  Weight:3799 g  Postpartum Day # 4 : S/P NSVD due to pt admitted on 6/22 for IOL for obesity bmi 41, tolac, progressed with foley, pit and AROM to [facility] 6/24 @ 1457, ebl 246mls, over intact perineum, hgb  drop of 9.1-8.5, asymptomatic, stable and on po iron. Had a couple elevated BP 140/90s, was not dx with ghtn, no extra labs taken or meds given, recommended one week BP check. Patient up ad lib, denies syncope or dizziness. Reports consuming regular diet without issues and denies N/V. Patient reports 0 bowel movement + passing flatus.  Denies issues with urination and reports bleeding is "lighter."  Patient is breast and bottle feeding and reports going well.  Desires undecided for postpartum contraception.  Pain is being appropriately managed with use of po meds.   Physical exam  Vitals:   03/05/21 0229 03/05/21 1438 03/05/21 2122 03/06/21 0538  BP: 131/73 123/75 132/82 107/63  Pulse: 67 67 74 71  Resp: 20 17 18 18   Temp: 97.6 F (36.4 C) 97.7 F (36.5 C) (!) 97.5 F (36.4 C) 97.8 F (36.6 C)  TempSrc: Oral  Oral Oral Oral  SpO2: 99% 99% 100%   Weight:      Height:       General: alert, cooperative, and no distress Lochia: appropriate Uterine Fundus: firm Perineum: Intact DVT Evaluation: No evidence of DVT seen on physical exam. Negative Homan's sign. No cords or calf tenderness. No significant calf/ankle edema.  Labs: Lab Results  Component Value Date   WBC 17.5 (H) 03/05/2021   HGB 8.5 (L) 03/05/2021   HCT 26.1 (L) 03/05/2021   MCV 87.0 03/05/2021   PLT 351 03/05/2021   CMP Latest Ref Rng & Units 02/25/2017  Glucose 65 - 99 mg/dL 129(H)  BUN 6 - 20 mg/dL <5(L)  Creatinine 0.44 - 1.00 mg/dL 0.53  Sodium 135 - 145 mmol/L 135  Potassium 3.5 - 5.1 mmol/L 3.4(L)  Chloride 101 - 111 mmol/L 108  CO2 22 - 32 mmol/L 20(L)  Calcium 8.9 - 10.3 mg/dL 9.0  Total Protein 6.5 - 8.1 g/dL 6.6  Total Bilirubin 0.3 - 1.2 mg/dL 0.3  Alkaline Phos 38 - 126 U/L 73  AST 15 - 41 U/L 21  ALT 14 - 54 U/L 15    Date of discharge: 03/06/2021 Discharge Diagnoses: Term Pregnancy-delivered Discharge instruction: per After Visit Summary and "Baby and Me Booklet".  After visit meds:   Activity:           unrestricted and pelvic rest Advance as tolerated. Pelvic rest for 6 weeks.  Diet:                routine Medications: PNV, Ibuprofen, Colace, and Iron Postpartum contraception: Undecided Condition:  Pt discharge to home with baby in stable and condition Anemia: Continue po iron.   Meds: Allergies as of 03/06/2021   No Known Allergies      Medication List     STOP taking these medications    Fusion Plus Caps   ondansetron 4 MG disintegrating tablet Commonly known as: Zofran ODT   valACYclovir 500 MG tablet Commonly known as: VALTREX       TAKE these medications    ferrous sulfate 325 (65 FE) MG tablet Take 325 mg by mouth daily with breakfast.   ibuprofen 600 MG tablet Commonly known as: ADVIL Take 1 tablet (600 mg total) by mouth every 6 (six) hours.        Discharge  Follow Up:   Follow-up [facility] Follow up.   Specialty: Obstetrics and Gynecology Why: 1 week BP check and 6 weeks PPV Contact information: [address] [place] [address] [phone]  [place], NP-C, CNM 03/06/2021, 10:25 PM  [name], [place]

## 2021-03-16 ENCOUNTER — Telehealth (HOSPITAL_COMMUNITY): Payer: Self-pay | Admitting: *Deleted

## 2021-03-16 NOTE — Telephone Encounter (Signed)
Left message to return nurse call. Odis Hollingshead, RN 03/16/2021 2:22pm

## 2022-12-27 ENCOUNTER — Encounter (HOSPITAL_COMMUNITY): Payer: Self-pay

## 2022-12-27 ENCOUNTER — Emergency Department (HOSPITAL_COMMUNITY)
Admission: EM | Admit: 2022-12-27 | Discharge: 2022-12-27 | Disposition: A | Discharge status: Home / Self Care | Payer: Self-pay | Attending: Emergency Medicine | Admitting: Emergency Medicine

## 2022-12-27 ENCOUNTER — Emergency Department (HOSPITAL_COMMUNITY): Payer: Self-pay

## 2022-12-27 ENCOUNTER — Other Ambulatory Visit: Payer: Self-pay

## 2022-12-27 DIAGNOSIS — R0789 Other chest pain: Secondary | ICD-10-CM | POA: Insufficient documentation

## 2022-12-27 LAB — BASIC METABOLIC PANEL
Anion gap: 10 (ref 5–15)
BUN: 11 mg/dL (ref 6–20)
CO2: 23 mmol/L (ref 22–32)
Calcium: 9.1 mg/dL (ref 8.9–10.3)
Chloride: 103 mmol/L (ref 98–111)
Creatinine, Ser: 0.65 mg/dL (ref 0.44–1.00)
GFR, Estimated: >60 mL/min (ref >60)
Glucose, Bld: 97 mg/dL (ref 70–99)
Potassium: 3.7 mmol/L (ref 3.5–5.1)
Sodium: 136 mmol/L (ref 135–145)

## 2022-12-27 LAB — CBC
HCT: 34.5 % — ABNORMAL LOW (ref 36.0–46.0)
Hemoglobin: 11.6 g/dL — ABNORMAL LOW (ref 12.0–15.0)
MCH: 28.9 pg (ref 26.0–34.0)
MCHC: 33.6 g/dL (ref 30.0–36.0)
MCV: 85.8 fL (ref 80.0–100.0)
Platelets: 503 10*3/uL — ABNORMAL HIGH (ref 150–400)
RBC: 4.02 MIL/uL (ref 3.87–5.11)
RDW: 13.2 % (ref 11.5–15.5)
WBC: 9.4 10*3/uL (ref 4.0–10.5)
nRBC: 0 % (ref 0.0–0.2)

## 2022-12-27 LAB — TROPONIN I (HIGH SENSITIVITY): Troponin I (High Sensitivity): <2 ng/L (ref <18)

## 2022-12-27 MED ORDER — PANTOPRAZOLE SODIUM 20 MG PO TBEC: 20.0000 mg | DELAYED_RELEASE_TABLET | Freq: Every day | ORAL | 0 refills | Status: AC

## 2022-12-27 MED ORDER — ALUM & MAG HYDROXIDE-SIMETH 200-200-20 MG/5ML PO SUSP
15.0000 mL | Freq: Once | ORAL | Status: AC
  Administered 2022-12-27: 15 mL via ORAL
  Filled 2022-12-27: qty 30

## 2022-12-27 MED ORDER — FAMOTIDINE 20 MG PO TABS
20.0000 mg | ORAL_TABLET | Freq: Once | ORAL | Status: AC
  Administered 2022-12-27: 20 mg via ORAL
  Filled 2022-12-27: qty 1

## 2022-12-27 NOTE — ED Provider Triage Note (Signed)
Emergency Medicine Provider Triage Evaluation Note  Bonnie Evans , a 37 y.o. female  was evaluated in triage.  Pt complains of chest pain.  Patient reports having chest pain for the past week to week and a half.  Notes worsening of symptoms with lying down flat at night or waking up after lying down flat in the morning.  States it is also worsens each later dinners before going to bed.  States has been trying at home reflux medication which is helping some.  Reports emergency department due to duration of time experiencing symptoms and concern for possibly "something else going on."  Denies fever, shortness of breath, cough, congestion, abdominal pain, nausea, vomiting, urinary symptoms, change in bowel habits..  Review of Systems  Positive: See above Negative:   Physical Exam  BP 129/67   Pulse 76   Temp 98.3 F (36.8 C) (Oral)   Resp 20   SpO2 99%  Gen:   Awake, no distress   Resp:  Normal effort  MSK:   Moves extremities without difficulty  Other:    Medical Decision Making  Medically screening exam initiated at 11:07 AM.  Appropriate orders placed.  Bonnie Evans was informed that the remainder of the evaluation will be completed by another provider, this initial triage assessment does not replace that evaluation, and the importance of remaining in the ED until their evaluation is complete.     Peter Garter, [place] 12/27/22 1108

## 2022-12-27 NOTE — ED Notes (Signed)
Patient transported to X-ray 

## 2022-12-27 NOTE — Discharge Instructions (Signed)
As discussed, symptoms most likely secondary to GERD or gastritis.  Will treat with medicine called pantoprazole daily.  Recommend follow-up with primary care for further assessment of presumed reflux.  See information attached regarding food/liquid/medicines that make symptoms worse.  Please do not hesitate to return to emergency department for worrisome signs and symptoms we discussed become apparent.

## 2022-12-27 NOTE — ED Triage Notes (Signed)
Pt came in via POV d/t mid CP for the past week that has not been alleviated with tylenol or reflux meds. Also endorses HA, A/Ox4, rates pain 6/10 while in triage.

## 2022-12-27 NOTE — ED Provider Notes (Signed)
[place] EMERGENCY DEPARTMENT AT [facility] Provider Note   CSN: [id number] Arrival date & time: 12/27/22  1033     History  Chief Complaint  Patient presents with   Chest Pain    Bonnie Evans is a 37 y.o. female.   Chest Pain   37 year old female presents emergency department with complaints of chest pain.  Patient reports having chest pain for the past week to week and a half.  Notes worsening of symptoms with lying down flat at night or waking up after lying down flat in the morning.  States it is also worsens each later dinners before going to bed.  States has been trying at home reflux medication which is helping some.  Reports emergency department due to duration of time experiencing symptoms and concern for possibly "something else going on."  Denies fever, shortness of breath, cough, congestion, abdominal pain, nausea, vomiting, urinary symptoms, change in bowel habits.  Past medical history significant for anemia, genital herpes, morbid obesity  Home Medications Prior to Admission medications   Medication Sig Start Date End Date Taking? Authorizing Provider  pantoprazole (PROTONIX) 20 MG tablet Take 1 tablet (20 mg total) by mouth daily. 12/27/22  Yes Sherian Maroon A, PA  ferrous sulfate 325 (65 FE) MG tablet Take 325 mg by mouth daily with breakfast.    [provider]  ibuprofen (ADVIL) 600 MG tablet Take 1 tablet (600 mg total) by mouth every 6 (six) hours. 03/06/21   Dale [place], FNP      Allergies    Patient has no known allergies.    Review of Systems   Review of Systems  Cardiovascular:  Positive for chest pain.  All other systems reviewed and are negative.   Physical Exam Updated Vital Signs BP 129/67   Pulse 76   Temp 98.3 F (36.8 C) (Oral)   Resp 20   LMP 12/27/2022   SpO2 99%  Physical Exam Vitals and nursing note reviewed.  Constitutional:      General: She is not in acute distress.    Appearance: She is  well-developed.  HENT:     Head: Normocephalic and atraumatic.  Eyes:     Conjunctiva/sclera: Conjunctivae normal.  Cardiovascular:     Rate and Rhythm: Normal rate and regular rhythm.     Heart sounds: No murmur heard. Pulmonary:     Effort: Pulmonary effort is normal. No respiratory distress.     Breath sounds: Normal breath sounds. No wheezing, rhonchi or rales.  Abdominal:     Palpations: Abdomen is soft.     Tenderness: There is no abdominal tenderness. There is no right CVA tenderness, left CVA tenderness or guarding.  Musculoskeletal:        General: No swelling.     Cervical back: Neck supple.     Right lower leg: No edema.     Left lower leg: No edema.  Skin:    General: Skin is warm and dry.     Capillary Refill: Capillary refill takes less than 2 seconds.  Neurological:     Mental Status: She is alert.  Psychiatric:        Mood and Affect: Mood normal.     ED Results / Procedures / Treatments   Labs (all labs ordered are listed, but only abnormal results are displayed) Labs Reviewed  CBC - Abnormal; Notable for the following components:      Result Value   Hemoglobin 11.6 (*)  HCT 34.5 (*)    Platelets 503 (*)    All other components within normal limits  BASIC METABOLIC PANEL  TROPONIN I (HIGH SENSITIVITY)  TROPONIN I (HIGH SENSITIVITY)    EKG None  Radiology DG Chest 1 View  Result Date: 12/27/2022 CLINICAL DATA:  Chest pain. EXAM: CHEST  1 VIEW COMPARISON:  08/09/2016 FINDINGS: The cardiac silhouette, mediastinal and hilar contours are normal. The lungs are clear. No pleural effusions. No pulmonary lesions. No pneumothorax. The bony thorax is intact. IMPRESSION: No acute cardiopulmonary findings. Electronically Signed   By: Rudie Meyer M.D.   On: 12/27/2022 11:51    Procedures Procedures    Medications Ordered in ED Medications  alum & mag hydroxide-simeth (MAALOX/MYLANTA) 200-200-20 MG/5ML suspension 15 mL (has no administration in time  range)  famotidine (PEPCID) tablet 20 mg (has no administration in time range)    ED Course/ Medical Decision Making/ A&P                             Medical Decision Making Amount and/or Complexity of Data Reviewed Labs: ordered. Radiology: ordered.  Risk OTC drugs.   This patient presents to the ED for concern of chest pain, this involves an extensive number of treatment options, and is a complaint that carries with it a high risk of complications and morbidity.  The differential diagnosis includes GERD, ACS, PE, pneumothorax, pneumonia, pericarditis/myocarditis/tamponade, CHF, aortic dissection, aortic aneurysm   Co morbidities that complicate the patient evaluation  See HPI   Additional history obtained:  Additional history obtained from EMR External records from outside source obtained and reviewed including hospital records   Lab Tests:  I Ordered, and personally interpreted labs.  The pertinent results include: No leukocytosis noted.  Mild evidence of anemia with hemoglobin 11.6 of which is improved from patient's baseline patient with thrombocytosis of 5030 which is also near patient's baseline.  No electrolyte abnormalities.  No renal dysfunction.  No troponin less than 2   Imaging Studies ordered:  I ordered imaging studies including chest x-ray I independently visualized and interpreted imaging which showed no acute cardiopulmonary abnormalities I agree with the radiologist interpretation   Cardiac Monitoring: / EKG:  The patient was maintained on a cardiac monitor.  I personally viewed and interpreted the cardiac monitored which showed an underlying rhythm of: Sinus rhythm with nonspecific T   Consultations Obtained:  N/a   Problem List / ED Course / Critical interventions / Medication management  Chest pain I ordered medication including Maalox/famotidine   Reevaluation of the patient after these medicines showed that the patient improved I have  reviewed the patients home medicines and have made adjustments as needed   Social Determinants of Health:  Denies tobacco, illicit   Test / Admission - Considered:  Chest pain Vitals signs within normal range and stable throughout visit. Laboratory/imaging studies significant for: See above 37 year old female presents emergency department with complaints of chest pain seemingly worsened with positional changes at night after late meals as well as in the morning after waking up.  Symptoms not related to exertion with no shortness of breath, cough, fever and improvement with antireflux medications.  Low suspicion for ACS given significant ischemic changes on EKG with negative troponin; did not perform a second troponin given duration of patient's symptoms and most likely culprit being GERD.  Patient PERC negative so doubt PE.  Chest x-ray is without signs of pneumonia, pneumothorax, widened mediastinum.  Patient had some improvement with administration of Maalox as well as Pepcid while emergency department.  Will trial pantoprazole outpatient when recommend dietary/lifestyle modifications as described in AVS.  Patient recommended follow-up with primary care for reassessment of symptoms.  Treatment plan discussed at length with patient and she acknowledged understanding was agreeable to said plan. Worrisome signs and symptoms were discussed with the patient, and the patient acknowledged understanding to return to the ED if noticed. Patient was stable upon discharge.          Final Clinical Impression(s) / ED Diagnoses Final diagnoses:  Atypical chest pain    Rx / DC Orders ED Discharge Orders          Ordered    pantoprazole (PROTONIX) 20 MG tablet  Daily        12/27/22 1358              Peter Garter, [place] 12/27/22 1411    Loetta Rough, MD 12/27/22 1704

## 2023-10-27 ENCOUNTER — Emergency Department (HOSPITAL_COMMUNITY)
Admission: EM | Admit: 2023-10-27 | Discharge: 2023-10-27 | Disposition: A | Discharge status: Home / Self Care | Payer: Self-pay | Attending: Emergency Medicine | Admitting: Emergency Medicine

## 2023-10-27 ENCOUNTER — Other Ambulatory Visit: Payer: Self-pay

## 2023-10-27 DIAGNOSIS — R059 Cough, unspecified: Secondary | ICD-10-CM | POA: Diagnosis not present

## 2023-10-27 DIAGNOSIS — R509 Fever, unspecified: Secondary | ICD-10-CM | POA: Diagnosis not present

## 2023-10-27 DIAGNOSIS — R55 Syncope and collapse: Secondary | ICD-10-CM | POA: Diagnosis present

## 2023-10-27 DIAGNOSIS — R1110 Vomiting, unspecified: Secondary | ICD-10-CM | POA: Diagnosis not present

## 2023-10-27 DIAGNOSIS — E860 Dehydration: Secondary | ICD-10-CM | POA: Insufficient documentation

## 2023-10-27 DIAGNOSIS — J111 Influenza due to unidentified influenza virus with other respiratory manifestations: Secondary | ICD-10-CM

## 2023-10-27 DIAGNOSIS — M791 Myalgia, unspecified site: Secondary | ICD-10-CM | POA: Insufficient documentation

## 2023-10-27 DIAGNOSIS — R197 Diarrhea, unspecified: Secondary | ICD-10-CM | POA: Diagnosis not present

## 2023-10-27 LAB — CBC WITH DIFFERENTIAL/PLATELET
Abs Immature Granulocytes: 0.08 10*3/uL — ABNORMAL HIGH (ref 0.00–0.07)
Basophils Absolute: 0 10*3/uL (ref 0.0–0.1)
Basophils Relative: 0 %
Eosinophils Absolute: 0.1 10*3/uL (ref 0.0–0.5)
Eosinophils Relative: 1 %
HCT: 34.6 % — ABNORMAL LOW (ref 36.0–46.0)
Hemoglobin: 10.9 g/dL — ABNORMAL LOW (ref 12.0–15.0)
Immature Granulocytes: 1 %
Lymphocytes Relative: 20 %
Lymphs Abs: 2.1 10*3/uL (ref 0.7–4.0)
MCH: 27.9 pg (ref 26.0–34.0)
MCHC: 31.5 g/dL (ref 30.0–36.0)
MCV: 88.5 fL (ref 80.0–100.0)
Monocytes Absolute: 0.5 10*3/uL (ref 0.1–1.0)
Monocytes Relative: 5 %
Neutro Abs: 8 10*3/uL — ABNORMAL HIGH (ref 1.7–7.7)
Neutrophils Relative %: 73 %
Platelets: 567 10*3/uL — ABNORMAL HIGH (ref 150–400)
RBC: 3.91 MIL/uL (ref 3.87–5.11)
RDW: 13.6 % (ref 11.5–15.5)
WBC: 10.8 10*3/uL — ABNORMAL HIGH (ref 4.0–10.5)
nRBC: 0 % (ref 0.0–0.2)

## 2023-10-27 LAB — RESP PANEL BY RT-PCR (RSV, FLU A&B, COVID)  RVPGX2
Influenza A by PCR: NEGATIVE
Influenza B by PCR: NEGATIVE
Resp Syncytial Virus by PCR: NEGATIVE
SARS Coronavirus 2 by RT PCR: NEGATIVE

## 2023-10-27 LAB — URINALYSIS, ROUTINE W REFLEX MICROSCOPIC
Bacteria, UA: NONE SEEN
Bilirubin Urine: NEGATIVE
Glucose, UA: NEGATIVE mg/dL
Ketones, ur: NEGATIVE mg/dL
Leukocytes,Ua: NEGATIVE
Nitrite: NEGATIVE
Protein, ur: NEGATIVE mg/dL
Specific Gravity, Urine: 1.003 — ABNORMAL LOW (ref 1.005–1.030)
pH: 6 (ref 5.0–8.0)

## 2023-10-27 LAB — BASIC METABOLIC PANEL
Anion gap: 8 (ref 5–15)
BUN: 8 mg/dL (ref 6–20)
CO2: 22 mmol/L (ref 22–32)
Calcium: 8.4 mg/dL — ABNORMAL LOW (ref 8.9–10.3)
Chloride: 108 mmol/L (ref 98–111)
Creatinine, Ser: 0.72 mg/dL (ref 0.44–1.00)
GFR, Estimated: >60 mL/min (ref >60)
Glucose, Bld: 109 mg/dL — ABNORMAL HIGH (ref 70–99)
Potassium: 3.2 mmol/L — ABNORMAL LOW (ref 3.5–5.1)
Sodium: 138 mmol/L (ref 135–145)

## 2023-10-27 LAB — PREGNANCY, URINE: Preg Test, Ur: NEGATIVE

## 2023-10-27 MED ORDER — SODIUM CHLORIDE 0.9 % IV BOLUS
1000.0000 mL | Freq: Once | INTRAVENOUS | Status: AC
  Administered 2023-10-27: 1000 mL via INTRAVENOUS

## 2023-10-27 NOTE — ED Provider Notes (Addendum)
[place] EMERGENCY DEPARTMENT AT [facility] Provider Note   CSN: [id number] Arrival date & time: 10/27/23  1229     History  Chief Complaint  Patient presents with   Dizziness   Cough    Bonnie Evans is a 38 y.o. female.  Patient brought in by EMS.  Patient was having dizziness without room spinning and felt as if she was going to pass out.  Felt fine yesterday.  However last week she did have a flulike illness with fever cough body aches and a little bit of vomiting and diarrhea.  EMS gave her 500 cc normal saline with improvement in symptoms.  On arrival here temp 98.3 oxygen sats are 100% pulse 69 blood pressure 129/57.  Past medical history significant for anemia.  Also significant for C-section.  Patient's never used tobacco products.  All of the flulike symptoms have resolved.  Patient denies any abdominal pain chest pain or shortness of breath.  Denies any room spinning.  So does not seem to be consistent with true vertigo.       Home Medications Prior to Admission medications   Medication Sig Start Date End Date Taking? Authorizing Provider  ferrous sulfate 325 (65 FE) MG tablet Take 325 mg by mouth daily with breakfast.    [provider]  ibuprofen (ADVIL) 600 MG tablet Take 1 tablet (600 mg total) by mouth every 6 (six) hours. 03/06/21   Dale [place], FNP  pantoprazole (PROTONIX) 20 MG tablet Take 1 tablet (20 mg total) by mouth daily. 12/27/22   Peter Garter, PA      Allergies    Patient has no known allergies.    Review of Systems   Review of Systems  Constitutional:  Positive for fatigue and fever. Negative for chills.  HENT:  Negative for ear pain and sore throat.   Eyes:  Negative for pain and visual disturbance.  Respiratory:  Positive for cough. Negative for shortness of breath.   Cardiovascular:  Negative for chest pain and palpitations.  Gastrointestinal:  Positive for diarrhea, nausea and vomiting. Negative for abdominal  pain.  Genitourinary:  Negative for dysuria and hematuria.  Musculoskeletal:  Negative for arthralgias and back pain.  Skin:  Negative for color change and rash.  Neurological:  Positive for dizziness and light-headedness. Negative for seizures and syncope.  All other systems reviewed and are negative.   Physical Exam Updated Vital Signs BP (!) 129/57   Pulse 69   Temp 98.3 F (36.8 C)   Resp 14   SpO2 100%  Physical Exam Vitals and nursing note reviewed.  Constitutional:      General: She is not in acute distress.    Appearance: Normal appearance. She is well-developed. She is not ill-appearing.  HENT:     Head: Normocephalic and atraumatic.     Mouth/Throat:     Mouth: Mucous membranes are dry.  Eyes:     Extraocular Movements: Extraocular movements intact.     Conjunctiva/sclera: Conjunctivae normal.     Pupils: Pupils are equal, round, and reactive to light.  Cardiovascular:     Rate and Rhythm: Normal rate and regular rhythm.     Heart sounds: No murmur heard. Pulmonary:     Effort: Pulmonary effort is normal. No respiratory distress.     Breath sounds: Normal breath sounds.  Abdominal:     Palpations: Abdomen is soft.     Tenderness: There is no abdominal tenderness.  Musculoskeletal:  General: No swelling.     Cervical back: Neck supple.  Skin:    General: Skin is warm and dry.     Capillary Refill: Capillary refill takes less than 2 seconds.  Neurological:     General: No focal deficit present.     Mental Status: She is alert and oriented to person, place, and time.  Psychiatric:        Mood and Affect: Mood normal.     ED Results / Procedures / Treatments   Labs (all labs ordered are listed, but only abnormal results are displayed) Labs Reviewed  CBC WITH DIFFERENTIAL/PLATELET - Abnormal; Notable for the following components:      Result Value   WBC 10.8 (*)    Hemoglobin 10.9 (*)    HCT 34.6 (*)    Platelets 567 (*)    Neutro Abs 8.0 (*)     Abs Immature Granulocytes 0.08 (*)    All other components within normal limits  BASIC METABOLIC PANEL - Abnormal; Notable for the following components:   Potassium 3.2 (*)    Glucose, Bld 109 (*)    Calcium 8.4 (*)    All other components within normal limits  URINALYSIS, ROUTINE W REFLEX MICROSCOPIC - Abnormal; Notable for the following components:   APPearance HAZY (*)    Specific Gravity, Urine 1.003 (*)    Hgb urine dipstick SMALL (*)    All other components within normal limits  RESP PANEL BY RT-PCR (RSV, FLU A&B, COVID)  RVPGX2  PREGNANCY, URINE    EKG EKG Interpretation Date/Time:  Saturday October 27 2023 12:50:19 EST Ventricular Rate:  72 PR Interval:  152 QRS Duration:  85 QT Interval:  523 QTC Calculation: 573 R Axis:   80  Text Interpretation: Sinus rhythm Low voltage, precordial leads Nonspecific T abnrm, anterolateral leads Prolonged QT interval Confirmed by Vanetta Mulders [phone]) on 10/27/2023 2:16:44 PM  Radiology No results found.  Procedures Procedures    Medications Ordered in ED Medications  sodium chloride 0.9 % bolus 1,000 mL (1,000 mLs Intravenous New Bag/Given 10/27/23 1336)    ED Course/ Medical Decision Making/ A&P                                 Medical Decision Making Amount and/or Complexity of Data Reviewed Labs: ordered.   Patient feeling better after the 500 cc bolus.  She is concerned she may be dehydrated.  Will give another liter of fluid and check electrolytes and get EKG.  Patient feeling much better after IV fluids.  Patient received 500 cc by EMS.  Will give another liter here.  Patient did urinate.  Urinalysis negative for urinary tract infection and appeared well-hydrated on that.  Pregnancy test negative.  Respiratory panel negative.  CBC white count 10.8 hemoglobin 10.9 platelets 567.  Patient metabolic panel normal other than potassium being just slightly down at 3.2.  Renal function normal.  Feel the patient  probably had a component of dehydration.  Patient did ambulate to use the bathroom without any difficulties.  No further dizziness or feeling like she is in a pass out.  Patient stable for discharge home.  Will need work note.   Final Clinical Impression(s) / ED Diagnoses Final diagnoses:  Near syncope  Influenza-like illness  Dehydration    Rx / DC Orders ED Discharge Orders     None  Vanetta Mulders, MD 10/27/23 1325    Vanetta Mulders, MD 10/27/23 1606

## 2023-10-27 NOTE — Discharge Instructions (Signed)
Hydrate yourself as we discussed.  Gatorade would be an excellent hydration fluid.  Workup here today without any acute findings.  Return for any new or worse symptoms.

## 2023-10-27 NOTE — ED Triage Notes (Signed)
Pt arrives via EMS  from home co episode of dizziness and weakness this morning. Pt endorses multiple episodes of dizziness/near syncope in the past. Endorses being ill this week with fever, cough, and bodyaches. EMS gave NS with improvement in symptoms.

## 2024-02-12 ENCOUNTER — Emergency Department (HOSPITAL_COMMUNITY)
Admission: EM | Admit: 2024-02-12 | Discharge: 2024-02-12 | Disposition: A | Discharge status: Home / Self Care | Attending: Emergency Medicine | Admitting: Emergency Medicine

## 2024-02-12 ENCOUNTER — Emergency Department (HOSPITAL_COMMUNITY)

## 2024-02-12 ENCOUNTER — Other Ambulatory Visit: Payer: Self-pay

## 2024-02-12 ENCOUNTER — Encounter (HOSPITAL_COMMUNITY): Payer: Self-pay | Admitting: Emergency Medicine

## 2024-02-12 DIAGNOSIS — J705 Respiratory conditions due to smoke inhalation: Secondary | ICD-10-CM | POA: Diagnosis present

## 2024-02-12 DIAGNOSIS — T59814A Toxic effect of smoke, undetermined, initial encounter: Secondary | ICD-10-CM | POA: Diagnosis not present

## 2024-02-12 DIAGNOSIS — T59811A Toxic effect of smoke, accidental (unintentional), initial encounter: Secondary | ICD-10-CM
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
## 2024-02-12 NOTE — ED Notes (Signed)
 Pt resting comfortably in room. Respirations even and unlabored. Discharge instructions reviewed. Follow up care and medications discussed. Pt verbalized understanding.
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
## 2024-02-12 NOTE — ED Notes (Signed)
 In peds with her children
